# Patient Record
Sex: Female | Born: 1995 | Race: White | Hispanic: No | Marital: Single | Smoking: Former smoker
Health system: Southern US, Community
[De-identification: ages and names within clinical notes are randomized; demographics above are authoritative.]

## PROBLEM LIST (undated history)

## (undated) DIAGNOSIS — Z789 Other specified health status: Secondary | ICD-10-CM

## (undated) DIAGNOSIS — L219 Seborrheic dermatitis, unspecified: Secondary | ICD-10-CM

## (undated) DIAGNOSIS — J310 Chronic rhinitis: Secondary | ICD-10-CM

## (undated) DIAGNOSIS — F329 Major depressive disorder, single episode, unspecified: Secondary | ICD-10-CM

## (undated) DIAGNOSIS — F419 Anxiety disorder, unspecified: Secondary | ICD-10-CM

## (undated) DIAGNOSIS — K219 Gastro-esophageal reflux disease without esophagitis: Secondary | ICD-10-CM

## (undated) DIAGNOSIS — F32A Depression, unspecified: Secondary | ICD-10-CM

## (undated) DIAGNOSIS — O24419 Gestational diabetes mellitus in pregnancy, unspecified control: Secondary | ICD-10-CM

## (undated) DIAGNOSIS — Z91018 Allergy to other foods: Secondary | ICD-10-CM

## (undated) DIAGNOSIS — L309 Dermatitis, unspecified: Secondary | ICD-10-CM

## (undated) HISTORY — DX: Depression, unspecified: F32.A

## (undated) HISTORY — PX: CHOLESTEATOMA EXCISION: SHX1345

## (undated) HISTORY — DX: Dermatitis, unspecified: L30.9

## (undated) HISTORY — DX: Gastro-esophageal reflux disease without esophagitis: K21.9

## (undated) HISTORY — DX: Anxiety disorder, unspecified: F41.9

## (undated) HISTORY — PX: OTHER SURGICAL HISTORY: SHX169

---
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
## 2015-08-05 HISTORY — PX: CHOLECYSTECTOMY: SHX55

## 2016-10-16 ENCOUNTER — Ambulatory Visit (INDEPENDENT_AMBULATORY_CARE_PROVIDER_SITE_OTHER): Payer: BLUE CROSS/BLUE SHIELD | Admitting: Allergy and Immunology

## 2016-10-16 ENCOUNTER — Encounter: Payer: Self-pay | Admitting: Allergy and Immunology

## 2016-10-16 VITALS — BP 112/70 | HR 98 | Temp 98.3°F | Resp 16 | Ht 64.76 in | Wt 133.8 lb

## 2016-10-16 DIAGNOSIS — R198 Other specified symptoms and signs involving the digestive system and abdomen: Secondary | ICD-10-CM

## 2016-10-16 DIAGNOSIS — L219 Seborrheic dermatitis, unspecified: Secondary | ICD-10-CM

## 2016-10-16 DIAGNOSIS — Z91018 Allergy to other foods: Secondary | ICD-10-CM

## 2016-10-16 DIAGNOSIS — J300 Vasomotor rhinitis: Secondary | ICD-10-CM

## 2016-10-16 DIAGNOSIS — J310 Chronic rhinitis: Secondary | ICD-10-CM

## 2016-10-16 DIAGNOSIS — R519 Headache, unspecified: Secondary | ICD-10-CM

## 2016-10-16 DIAGNOSIS — R51 Headache: Secondary | ICD-10-CM

## 2016-10-16 HISTORY — DX: Headache, unspecified: R51.9

## 2016-10-16 HISTORY — DX: Chronic rhinitis: J31.0

## 2016-10-16 HISTORY — DX: Allergy to other foods: Z91.018

## 2016-10-16 HISTORY — DX: Seborrheic dermatitis, unspecified: L21.9

## 2016-10-16 MED ORDER — KETOCONAZOLE 2 % EX SHAM: 1.0000 "application " | MEDICATED_SHAMPOO | CUTANEOUS | 5 refills | Status: DC

## 2016-10-16 NOTE — Assessment & Plan Note (Signed)
Non-allergic rhinitis.  All seasonal and perennial aeroallergen skin tests are negative despite a positive histamine control.  Intranasal steroids and intranasal antihistamines are effective for symptoms associated with non-allergic rhinitis, whereas second generation antihistamines such as cetirizine, loratadine and fexofenadine have been found to be ineffective for this condition.  Fluticasone (Flonase) nasal spray, one spray per nostril 1-2 times daily as needed.  Nasal saline spray (i.e. Simply Saline) as needed prior to medicated nasal sprays.

## 2016-10-16 NOTE — Progress Notes (Signed)
New Patient Note  RE: Shelby Hayden MRN: [id number] DOB: 1995/10/28 Date of Office Visit: 10/16/2016  Referring provider: No ref. provider found Primary care provider: Marthenia Rolling, MD  Chief Complaint: Nausea; Vomiting; and Rash   History of present illness: Shelby Hayden is a 21 y.o. female presenting today for evaluation of possible food allergy/intolerance.  She reports that since January 2017 she has experienced 3 prolonged episodes of nausea, vomiting, and abdominal cramping.  She is interested in allergy evaluation in her search for an etiology though no specific food or environmental triggers have been identified which seemed to correlate with these symptoms. The episodes last from a few weeks to a couple months.  Typically, the episodes start out with such severity that she vomits bile and is unable to consume food or beverages, therefore requiring Phenergan suppositories and IV hydration.  Over time, the symptoms come less severe and she is able to control the nausea and vomiting with Zofran.  She does have associated right-sided headaches as well as photophobia and audiophobia.  She prefers to lie down in a dark room without any noise.  She denies scotoma.  She states that on a few occasions she has become lightheaded and almost passed out when standing up in on one occasion states that she did pass out.  She has had extensive gastroenterology evaluation.  In early 2017 she underwent cholecystectomy, however her symptoms had resolved prior to the surgery and returned in June 2017 after the surgery.  Gelene experiences rhinorrhea and occasional postnasal drainage. No significant seasonal symptom variation has been noted nor have specific environmental triggers been identified.  She develops oily, flaky, mildly pruritic, erythematous areas around the creases of her nose, ears, as well as on the medial aspects of her eyelids as well as her scalp.  She has tried topical steroids without  benefit.   Assessment and plan: GI symptoms The patient's history suggests the possibility of atypical migraines. Skin tests to select food allergens were negative today. The negative predictive value of food allergen skin testing is excellent (approximately 95%).   If GI symptoms recur, persist or progress, neurologist evaluation may be warranted.  Recurrent headaches  I have recommended neurologist evaluation/treatment.  Chronic rhinitis Non-allergic rhinitis.  All seasonal and perennial aeroallergen skin tests are negative despite a positive histamine control.  Intranasal steroids and intranasal antihistamines are effective for symptoms associated with non-allergic rhinitis, whereas second generation antihistamines such as cetirizine, loratadine and fexofenadine have been found to be ineffective for this condition.  Fluticasone (Flonase) nasal spray, one spray per nostril 1-2 times daily as needed.  Nasal saline spray (i.e. Simply Saline) as needed prior to medicated nasal sprays.  Seborrheic dermatitis  A prescription has been provided for Nizoral 2% shampoo.  Lather on the scalp and face, and ears daily as needed.   Meds ordered this encounter  Medications  . ketoconazole (NIZORAL) 2 % shampoo    Sig: Apply 1 application topically 2 (two) times a week.    Dispense:  120 mL    Refill:  5    Diagnostics: Environmental skin testing:  Negative despite a positive histamine control. Food allergen skin testing:  Negative despite a positive histamine control.    Physical examination: Blood pressure 112/70, pulse 98, temperature 98.3 F (36.8 C), temperature source Oral, resp. rate 16, height 5' 4.76" (1.645 m), weight 133 lb 13.1 oz (60.7 kg), SpO2 98 %.  General: Alert, interactive, in no acute distress. HEENT: TMs pearly gray,  turbinates mildly edematous without discharge, post-pharynx mildly erythematous. Neck: Supple without lymphadenopathy. Lungs: Clear to auscultation  without wheezing, rhonchi or rales. CV: Normal S1, S2 without murmurs. Abdomen: Nondistended, nontender. Skin: flaky, erythematous areas around the ear and eyelids. Extremities:  No clubbing, cyanosis or edema. Neuro:   Grossly intact.  Review of systems:  Review of systems negative except as noted in HPI / PMHx or noted below: Review of Systems  Constitutional: Negative.   HENT: Negative.   Eyes: Negative.   Respiratory: Negative.   Cardiovascular: Negative.   Gastrointestinal: Negative.   Genitourinary: Negative.   Musculoskeletal: Negative.   Skin: Negative.   Neurological: Negative.   Endo/Heme/Allergies: Negative.   Psychiatric/Behavioral: Negative.     Past medical history:  Past Medical History:  Diagnosis Date  . Eczema     Past surgical history:  Past Surgical History:  Procedure Laterality Date  . CHOLECYSTECTOMY  08/2015    Family history: Family History  Problem Relation Age of Onset  . Asthma Sister   . Allergic rhinitis Sister   . Asthma Maternal Grandmother   . Asthma Paternal Grandmother     Social history: Social History   Social History  . Marital status: Single    Spouse name: N/A  . Number of children: N/A  . Years of education: N/A   Occupational History  . Not on file.   Social History Main Topics  . Smoking status: Former Smoker    Packs/day: 1.00    Types: Cigarettes  . Smokeless tobacco: Never Used     Comment: one  pack Q 2-3 weeks  . Alcohol use Not on file  . Drug use: Unknown  . Sexual activity: Not on file   Other Topics Concern  . Not on file   Social History Narrative  . No narrative on file   Environmental History: The patient lives in a 21 year old house with laminate floors throughout, electric heat, and window air conditioning units.  There is no known mold or water damage in the home.  There is a dog and a cat in the house which have access to her bedroom.    Allergies as of 10/16/2016      Reactions    Latex Rash      Medication List       Accurate as of 10/16/16  1:23 PM. Always use your most recent med list.          famotidine 20 MG tablet Commonly known as:  PEPCID Take 20 mg by mouth daily.   Ibuprofen 200 MG Caps Take by mouth as needed.   ketoconazole 2 % shampoo Commonly known as:  NIZORAL Apply 1 application topically 2 (two) times a week.   ondansetron 4 MG disintegrating tablet Commonly known as:  ZOFRAN-ODT Take 4 mg by mouth.       Known medication allergies: Allergies  Allergen Reactions  . Latex Rash    I appreciate the opportunity to take part in Dyonna's care. Please do not hesitate to contact me with questions.  Sincerely,   R. Jorene Guestarter Noralee Dutko, MD

## 2016-10-16 NOTE — Assessment & Plan Note (Signed)
   I have recommended neurologist evaluation/treatment.

## 2016-10-16 NOTE — Assessment & Plan Note (Signed)
The patient's history suggests the possibility of atypical migraines. Skin tests to select food allergens were negative today. The negative predictive value of food allergen skin testing is excellent (approximately 95%).   If GI symptoms recur, persist or progress, neurologist evaluation may be warranted.

## 2016-10-16 NOTE — Patient Instructions (Signed)
GI symptoms The patient's history suggests the possibility of atypical migraines. Skin tests to select food allergens were negative today. The negative predictive value of food allergen skin testing is excellent (approximately 95%).   If GI symptoms recur, persist or progress, neurologist evaluation may be warranted.  Recurrent headaches  I have recommended neurologist evaluation/treatment.  Chronic rhinitis Non-allergic rhinitis.  All seasonal and perennial aeroallergen skin tests are negative despite a positive histamine control.  Intranasal steroids and intranasal antihistamines are effective for symptoms associated with non-allergic rhinitis, whereas second generation antihistamines such as cetirizine, loratadine and fexofenadine have been found to be ineffective for this condition.  Fluticasone (Flonase) nasal spray, one spray per nostril 1-2 times daily as needed.  Nasal saline spray (i.e. Simply Saline) as needed prior to medicated nasal sprays.  Seborrheic dermatitis  A prescription has been provided for Nizoral 2% shampoo.  Lather on the scalp and face, and ears daily as needed.   Return if symptoms worsen or fail to improve.

## 2016-10-16 NOTE — Assessment & Plan Note (Signed)
   A prescription has been provided for Nizoral 2% shampoo.  Lather on the scalp and face, and ears daily as needed.

## 2017-12-23 DIAGNOSIS — F17200 Nicotine dependence, unspecified, uncomplicated: Secondary | ICD-10-CM | POA: Insufficient documentation

## 2018-07-23 LAB — TSH: TSH: 0.843

## 2018-07-26 LAB — OB RESULTS CONSOLE HIV ANTIBODY (ROUTINE TESTING): HIV: NONREACTIVE

## 2018-07-26 LAB — OB RESULTS CONSOLE ABO/RH: RH Type: POSITIVE

## 2018-07-26 LAB — CHG URINE PREGNANCY TEST VISUAL COLOR CMPRSN METHS: Preg Test, Ur: POSITIVE

## 2018-07-26 LAB — OB RESULTS CONSOLE HGB/HCT, BLOOD
HCT: 46 — AB (ref 29–41)
Hemoglobin: 16.1

## 2018-07-26 LAB — OB RESULTS CONSOLE ANTIBODY SCREEN: Antibody Screen: NEGATIVE

## 2018-07-26 LAB — OB RESULTS CONSOLE PLATELET COUNT: Platelets: 206

## 2018-07-26 LAB — OB RESULTS CONSOLE GC/CHLAMYDIA
Chlamydia: NEGATIVE
Gonorrhea: NEGATIVE

## 2018-07-26 LAB — CULTURE, OB URINE: Urine Culture, OB: <10000

## 2018-07-26 LAB — OB RESULTS CONSOLE VARICELLA ZOSTER ANTIBODY, IGG: Varicella: IMMUNE

## 2018-07-26 LAB — OB RESULTS CONSOLE RUBELLA ANTIBODY, IGM: Rubella: IMMUNE

## 2018-07-26 LAB — OB RESULTS CONSOLE HEPATITIS B SURFACE ANTIGEN: Hepatitis B Surface Ag: NEGATIVE

## 2018-08-04 NOTE — L&D Delivery Note (Signed)
LABOR COURSE Patient was admitted for IOL for A1GDM. She received two doses of Cytotec and had a foley bulb placed as part of her induction. She was also augmented with Pitocin. She SROM'd today at 0730.   Delivery Note Called to room and patient was complete and pushing. Head delivered ROT. LKoose nuchal cord x 1 loop present. Delivered through. Shoulder and body delivered in usual fashion. At  1622 a viable female was delivered via Vaginal, Spontaneous (Presentation:ROT ; ROA).  Infant with poor color, tone and without spontaneous cry, placed on mother's abdomen, dried and stimulated. Cord clamped x 2 after 1-minute delay, and cut by FOB. Taken to warmer for further assessment. Cord blood drawn. Placenta delivered spontaneously with gentle cord traction. Appears intact. Fundus firm with massage and Pitocin. Labia, perineum, vagina, and cervix inspected with no lacerations noted.    APGAR:7,9; weight: 3405g  .   Cord: 3VC with the following complications:N/A.   Cord pH: Not collected  Anesthesia: Epidural Episiotomy: None Lacerations: None Q Blood Loss (mL): 200  Mom to postpartum.  Baby to Couplet care / Skin to Skin.  Mallie Snooks, CNM 03/12/19 7:45 PM

## 2018-08-24 LAB — FIRST TRIMESTER SCREEN W/NT: FIRST TRIMESTER SAMPLE: NEGATIVE

## 2018-10-12 LAB — AFP, SERUM, OPEN SPINA BIFIDA: AFP: NEGATIVE

## 2018-12-06 LAB — OB RESULTS CONSOLE RPR
RPR: NONREACTIVE
RPR: NONREACTIVE

## 2018-12-06 LAB — OB RESULTS CONSOLE HGB/HCT, BLOOD
HCT: 37 (ref 29–41)
Hemoglobin: 12.7

## 2018-12-06 LAB — OB RESULTS CONSOLE HIV ANTIBODY (ROUTINE TESTING): HIV: NONREACTIVE

## 2018-12-06 LAB — GLUCOSE, 1 HOUR: Glucose 1 Hour: 168

## 2018-12-06 LAB — OB RESULTS CONSOLE PLATELET COUNT: Platelets: 200

## 2018-12-09 DIAGNOSIS — O24410 Gestational diabetes mellitus in pregnancy, diet controlled: Secondary | ICD-10-CM | POA: Insufficient documentation

## 2018-12-10 LAB — GLUCOSE, 3 HOUR GESTATIONAL

## 2018-12-22 ENCOUNTER — Other Ambulatory Visit: Payer: Self-pay

## 2018-12-22 ENCOUNTER — Inpatient Hospital Stay (HOSPITAL_COMMUNITY)
Admission: EM | Admit: 2018-12-22 | Discharge: 2018-12-22 | Disposition: A | Discharge status: Home / Self Care | Payer: BLUE CROSS/BLUE SHIELD | Attending: Family Medicine | Admitting: Family Medicine

## 2018-12-22 ENCOUNTER — Encounter (HOSPITAL_COMMUNITY): Payer: Self-pay | Admitting: *Deleted

## 2018-12-22 DIAGNOSIS — Z91040 Latex allergy status: Secondary | ICD-10-CM | POA: Diagnosis not present

## 2018-12-22 DIAGNOSIS — R109 Unspecified abdominal pain: Secondary | ICD-10-CM | POA: Insufficient documentation

## 2018-12-22 DIAGNOSIS — O9989 Other specified diseases and conditions complicating pregnancy, childbirth and the puerperium: Secondary | ICD-10-CM | POA: Diagnosis not present

## 2018-12-22 DIAGNOSIS — Z87891 Personal history of nicotine dependence: Secondary | ICD-10-CM | POA: Insufficient documentation

## 2018-12-22 DIAGNOSIS — Z833 Family history of diabetes mellitus: Secondary | ICD-10-CM | POA: Diagnosis not present

## 2018-12-22 DIAGNOSIS — Z8042 Family history of malignant neoplasm of prostate: Secondary | ICD-10-CM | POA: Insufficient documentation

## 2018-12-22 DIAGNOSIS — Z3493 Encounter for supervision of normal pregnancy, unspecified, third trimester: Secondary | ICD-10-CM

## 2018-12-22 DIAGNOSIS — Z3A29 29 weeks gestation of pregnancy: Secondary | ICD-10-CM | POA: Diagnosis not present

## 2018-12-22 HISTORY — DX: Other specified health status: Z78.9

## 2018-12-22 LAB — CBC WITH DIFFERENTIAL/PLATELET
Abs Immature Granulocytes: 0.03 10*3/uL (ref 0.00–0.07)
Basophils Absolute: 0 10*3/uL (ref 0.0–0.1)
Basophils Relative: 1 %
Eosinophils Absolute: 0 10*3/uL (ref 0.0–0.5)
Eosinophils Relative: 0 %
HCT: 35.3 % — ABNORMAL LOW (ref 36.0–46.0)
Hemoglobin: 12 g/dL (ref 12.0–15.0)
Immature Granulocytes: 0 %
Lymphocytes Relative: 18 %
Lymphs Abs: 1.5 10*3/uL (ref 0.7–4.0)
MCH: 29.4 pg (ref 26.0–34.0)
MCHC: 34 g/dL (ref 30.0–36.0)
MCV: 86.5 fL (ref 80.0–100.0)
Monocytes Absolute: 0.7 10*3/uL (ref 0.1–1.0)
Monocytes Relative: 8 %
Neutro Abs: 6.1 10*3/uL (ref 1.7–7.7)
Neutrophils Relative %: 73 %
Platelets: 190 10*3/uL (ref 150–400)
RBC: 4.08 MIL/uL (ref 3.87–5.11)
RDW: 12.1 % (ref 11.5–15.5)
WBC: 8.4 10*3/uL (ref 4.0–10.5)
nRBC: 0 % (ref 0.0–0.2)

## 2018-12-22 LAB — BASIC METABOLIC PANEL
Anion gap: 8 (ref 5–15)
BUN: <5 mg/dL — ABNORMAL LOW (ref 6–20)
CO2: 21 mmol/L — ABNORMAL LOW (ref 22–32)
Calcium: 8.8 mg/dL — ABNORMAL LOW (ref 8.9–10.3)
Chloride: 108 mmol/L (ref 98–111)
Creatinine, Ser: 0.41 mg/dL — ABNORMAL LOW (ref 0.44–1.00)
GFR calc Af Amer: >60 mL/min (ref >60)
GFR calc non Af Amer: >60 mL/min (ref >60)
Glucose, Bld: 141 mg/dL — ABNORMAL HIGH (ref 70–99)
Potassium: 3.4 mmol/L — ABNORMAL LOW (ref 3.5–5.1)
Sodium: 137 mmol/L (ref 135–145)

## 2018-12-22 LAB — URINALYSIS, ROUTINE W REFLEX MICROSCOPIC
Bilirubin Urine: NEGATIVE
Glucose, UA: NEGATIVE mg/dL
Hgb urine dipstick: NEGATIVE
Ketones, ur: 20 mg/dL — AB
Nitrite: NEGATIVE
Protein, ur: NEGATIVE mg/dL
Specific Gravity, Urine: 1.013 (ref 1.005–1.030)
pH: 7 (ref 5.0–8.0)

## 2018-12-22 LAB — ABO/RH: ABO/RH(D): O POS

## 2018-12-22 LAB — GLUCOSE, CAPILLARY: Glucose-Capillary: 153 mg/dL — ABNORMAL HIGH (ref 70–99)

## 2018-12-22 MED ORDER — ACETAMINOPHEN 500 MG PO TABS
1000.0000 mg | ORAL_TABLET | Freq: Once | ORAL | Status: AC
  Administered 2018-12-22: 1000 mg via ORAL
  Filled 2018-12-22: qty 2
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
## 2018-12-23 ENCOUNTER — Encounter: Payer: Self-pay | Admitting: Allergy and Immunology

## 2018-12-23 ENCOUNTER — Encounter (HOSPITAL_COMMUNITY): Payer: Self-pay

## 2019-01-26 ENCOUNTER — Encounter: Payer: Self-pay | Admitting: *Deleted

## 2019-01-28 ENCOUNTER — Other Ambulatory Visit: Payer: Self-pay

## 2019-01-28 ENCOUNTER — Ambulatory Visit (INDEPENDENT_AMBULATORY_CARE_PROVIDER_SITE_OTHER): Payer: BC Managed Care – PPO | Admitting: Obstetrics & Gynecology

## 2019-01-28 ENCOUNTER — Encounter: Payer: Self-pay | Admitting: Obstetrics & Gynecology

## 2019-01-28 VITALS — BP 121/74 | HR 90 | Temp 98.0°F | Wt 194.9 lb

## 2019-01-28 DIAGNOSIS — O0993 Supervision of high risk pregnancy, unspecified, third trimester: Secondary | ICD-10-CM

## 2019-01-28 DIAGNOSIS — O24410 Gestational diabetes mellitus in pregnancy, diet controlled: Secondary | ICD-10-CM

## 2019-01-28 DIAGNOSIS — Z3A34 34 weeks gestation of pregnancy: Secondary | ICD-10-CM

## 2019-01-28 NOTE — Progress Notes (Addendum)
   PRENATAL VISIT NOTE  Subjective:  Shelby Hayden is a 23 y.o. G1P0000 at [redacted]w[redacted]d being seen today for transfer of prenatal care from [facility].  Previous prenatal notes are seen in [place].   She is currently monitored for the following issues for this high-risk pregnancy and has Diet controlled gestational diabetes mellitus in third trimester; Depression; Smoker; and Supervision of high-risk pregnancy, third trimester on their problem list.  Patient reports no complaints.  Contractions: Not present. Vag. Bleeding: None.  Movement: Present. Denies leaking of fluid.   The following portions of the patient's history were reviewed and updated as appropriate: allergies, current medications, past family history, past medical history, past social history, past surgical history and problem list.   Objective:   Vitals:   01/28/19 1010  BP: 121/74  Pulse: 90  Temp: 98 F (36.7 C)  Weight: 194 lb 14.4 oz (88.4 kg)    Fetal Status: Fetal Heart Rate (bpm): 145 Fundal Height: 34 cm Movement: Present     General:  Alert, oriented and cooperative. Patient is in no acute distress.  Skin: Skin is warm and dry. No rash noted.   Cardiovascular: Normal heart rate noted  Respiratory: Normal respiratory effort, no problems with respiration noted  Abdomen: Soft, gravid, appropriate for gestational age.  Pain/Pressure: Absent     Pelvic: Cervical exam deferred        Extremities: Normal range of motion.  Edema: None  Mental Status: Normal mood and affect. Normal behavior. Normal judgment and thought content.   Assessment and Plan:  Pregnancy: G1P0000 at [redacted]w[redacted]d 1. Diet controlled gestational diabetes mellitus in third trimester Reports normal BS, did not bring log. Continue diet control. Growth scan ordered at 37 weeks.  - [place] MFM OB DETAIL +14 WK; Future  2. Supervision of high-risk pregnancy, third trimester Pelvic cultures next visit. Enrolled in [employer], will get BP cuff soon.  - [facility] AMB  BABYSCRIPTS SCHEDULE OPTIMIZATION - Enroll Patient in [employer]  Preterm labor symptoms and general obstetric precautions including but not limited to vaginal bleeding, contractions, leaking of fluid and fetal movement were reviewed in detail with the patient. Please refer to After Visit Summary for other counseling recommendations.   The nature of [place] with multiple MDs and other Advanced Practice Providers was explained to patient; also emphasized that residents, students are part of our team.  Return in about 17 days (around 02/14/2019) for Office [facility] Visit, Pelvic cultures (schedule on same day as ultrasound).  Future Appointments  Date Time Provider [place]  02/15/2019  2:15 PM [place] 4 WH-MFCUS MFC-US  02/15/2019  2:20 PM [place] NURSE [place] MFC-US  02/17/2019  3:55 PM [place] Mode, MD [place] Janiene Aarons, MD

## 2019-01-28 NOTE — Patient Instructions (Addendum)
Return to office for any scheduled appointments. Call the office or go to the MAU at [place] at [facility] if:  You begin to have strong, frequent contractions  Your water breaks.  Sometimes it is a big gush of fluid, sometimes it is just a trickle that keeps getting your panties wet or running down your legs  You have vaginal bleeding.  It is normal to have a small amount of spotting if your cervix was checked.   You do not feel your baby moving like normal.  If you do not, get something to eat and drink and lay down and focus on feeling your baby move.   If your baby is still not moving like normal, you should call the office or go to MAU.  Any other obstetric concerns.    Third Trimester of Pregnancy The third trimester is from week 28 through week 40 (months 7 through 9). The third trimester is a time when the unborn baby (fetus) is growing rapidly. At the end of the ninth month, the fetus is about 20 inches in length and weighs 6-10 pounds. Body changes during your third trimester Your body will continue to go through many changes during pregnancy. The changes vary from woman to woman. During the third trimester:  Your weight will continue to increase. You can expect to gain 25-35 pounds (11-16 kg) by the end of the pregnancy.  You may begin to get stretch marks on your hips, abdomen, and breasts.  You may urinate more often because the fetus is moving lower into your pelvis and pressing on your bladder.  You may develop or continue to have heartburn. This is caused by increased hormones that slow down muscles in the digestive tract.  You may develop or continue to have constipation because increased hormones slow digestion and cause the muscles that push waste through your intestines to relax.  You may develop hemorrhoids. These are swollen veins (varicose veins) in the rectum that can itch or be painful.  You may develop swollen, bulging veins (varicose  veins) in your legs.  You may have increased body aches in the pelvis, back, or thighs. This is due to weight gain and increased hormones that are relaxing your joints.  You may have changes in your hair. These can include thickening of your hair, rapid growth, and changes in texture. Some women also have hair loss during or after pregnancy, or hair that feels dry or thin. Your hair will most likely return to normal after your baby is born.  Your breasts will continue to grow and they will continue to become tender. A yellow fluid (colostrum) may leak from your breasts. This is the first milk you are producing for your baby.  Your belly button may stick out.  You may notice more swelling in your hands, face, or ankles.  You may have increased tingling or numbness in your hands, arms, and legs. The skin on your belly may also feel numb.  You may feel short of breath because of your expanding uterus.  You may have more problems sleeping. This can be caused by the size of your belly, increased need to urinate, and an increase in your body's metabolism.  You may notice the fetus "dropping," or moving lower in your abdomen (lightening).  You may have increased vaginal discharge.  You may notice your joints feel loose and you may have pain around your pelvic bone. What to expect at prenatal visits You will  have prenatal exams every 2 weeks until week 36. Then you will have weekly prenatal exams. During a routine prenatal visit:  You will be weighed to make sure you and the baby are growing normally.  Your blood pressure will be taken.  Your abdomen will be measured to track your baby's growth.  The fetal heartbeat will be listened to.  Any test results from the previous visit will be discussed.  You may have a cervical check near your due date to see if your cervix has softened or thinned (effaced).  You will be tested for Group B streptococcus. This happens between 35 and 37 weeks.  Your health care provider may ask you:  What your birth plan is.  How you are feeling.  If you are feeling the baby move.  If you have had any abnormal symptoms, such as leaking fluid, bleeding, severe headaches, or abdominal cramping.  If you are using any tobacco products, including cigarettes, chewing tobacco, and electronic cigarettes.  If you have any questions. Other tests or screenings that may be performed during your third trimester include:  Blood tests that check for low iron levels (anemia).  Fetal testing to check the health, activity level, and growth of the fetus. Testing is done if you have certain medical conditions or if there are problems during the pregnancy.  Nonstress test (NST). This test checks the health of your baby to make sure there are no signs of problems, such as the baby not getting enough oxygen. During this test, a belt is placed around your belly. The baby is made to move, and its heart rate is monitored during movement. What is false labor? False labor is a condition in which you feel small, irregular tightenings of the muscles in the womb (contractions) that usually go away with rest, changing position, or drinking water. These are called Braxton Hicks contractions. Contractions may last for hours, days, or even weeks before true labor sets in. If contractions come at regular intervals, become more frequent, increase in intensity, or become painful, you should see your health care provider. What are the signs of labor?  Abdominal cramps.  Regular contractions that start at 10 minutes apart and become stronger and more frequent with time.  Contractions that start on the top of the uterus and spread down to the lower abdomen and back.  Increased pelvic pressure and dull back pain.  A watery or bloody mucus discharge that comes from the vagina.  Leaking of amniotic fluid. This is also known as your "water breaking." It could be a slow trickle or a  gush. Let your health care provider know if it has a color or strange odor. If you have any of these signs, call your health care provider right away, even if it is before your due date. Follow these instructions at home: Medicines  Follow your health care provider's instructions regarding medicine use. Specific medicines may be either safe or unsafe to take during pregnancy.  Take a prenatal vitamin that contains at least 600 micrograms (mcg) of folic acid.  If you develop constipation, try taking a stool softener if your health care provider approves. Eating and drinking   Eat a balanced diet that includes fresh fruits and vegetables, whole grains, good sources of protein such as meat, eggs, or tofu, and low-fat dairy. Your health care provider will help you determine the amount of weight gain that is right for you.  Avoid raw meat and uncooked cheese. These carry germs  that can cause birth defects in the baby.  If you have low calcium intake from food, talk to your health care provider about whether you should take a daily calcium supplement.  Eat four or five small meals rather than three large meals a day.  Limit foods that are high in fat and processed sugars, such as fried and sweet foods.  To prevent constipation: ? Drink enough fluid to keep your urine clear or pale yellow. ? Eat foods that are high in fiber, such as fresh fruits and vegetables, whole grains, and beans. Activity  Exercise only as directed by your health care provider. Most women can continue their usual exercise routine during pregnancy. Try to exercise for 30 minutes at least 5 days a week. Stop exercising if you experience uterine contractions.  Avoid heavy lifting.  Do not exercise in extreme heat or humidity, or at high altitudes.  Wear low-heel, comfortable shoes.  Practice good posture.  You may continue to have sex unless your health care provider tells you otherwise. Relieving pain and  discomfort  Take frequent breaks and rest with your legs elevated if you have leg cramps or low back pain.  Take warm sitz baths to soothe any pain or discomfort caused by hemorrhoids. Use hemorrhoid cream if your health care provider approves.  Wear a good support bra to prevent discomfort from breast tenderness.  If you develop varicose veins: ? Wear support pantyhose or compression stockings as told by your healthcare provider. ? Elevate your feet for 15 minutes, 3-4 times a day. Prenatal care  Write down your questions. Take them to your prenatal visits.  Keep all your prenatal visits as told by your health care provider. This is important. Safety  Wear your seat belt at all times when driving.  Make a list of emergency phone numbers, including numbers for family, friends, the hospital, and police and fire departments. General instructions  Avoid cat litter boxes and soil used by cats. These carry germs that can cause birth defects in the baby. If you have a cat, ask someone to clean the litter box for you.  Do not travel far distances unless it is absolutely necessary and only with the approval of your health care provider.  Do not use hot tubs, steam rooms, or saunas.  Do not drink alcohol.  Do not use any products that contain nicotine or tobacco, such as cigarettes and e-cigarettes. If you need help quitting, ask your health care provider.  Do not use any medicinal herbs or unprescribed drugs. These chemicals affect the formation and growth of the baby.  Do not douche or use tampons or scented sanitary pads.  Do not cross your legs for long periods of time.  To prepare for the arrival of your baby: ? Take prenatal classes to understand, practice, and ask questions about labor and delivery. ? Make a trial run to the hospital. ? Visit the hospital and tour the maternity area. ? Arrange for maternity or paternity leave through employers. ? Arrange for family and  friends to take care of pets while you are in the hospital. ? Purchase a rear-facing car seat and make sure you know how to install it in your car. ? Pack your hospital bag. ? Prepare the baby's nursery. Make sure to remove all pillows and stuffed animals from the baby's crib to prevent suffocation.  Visit your dentist if you have not gone during your pregnancy. Use a soft toothbrush to brush your teeth and  be gentle when you floss. Contact a health care provider if:  You are unsure if you are in labor or if your water has broken.  You become dizzy.  You have mild pelvic cramps, pelvic pressure, or nagging pain in your abdominal area.  You have lower back pain.  You have persistent nausea, vomiting, or diarrhea.  You have an unusual or bad smelling vaginal discharge.  You have pain when you urinate. Get help right away if:  Your water breaks before 37 weeks.  You have regular contractions less than 5 minutes apart before 37 weeks.  You have a fever.  You are leaking fluid from your vagina.  You have spotting or bleeding from your vagina.  You have severe abdominal pain or cramping.  You have rapid weight loss or weight gain.  You have shortness of breath with chest pain.  You notice sudden or extreme swelling of your face, hands, ankles, feet, or legs.  Your baby makes fewer than 10 movements in 2 hours.  You have severe headaches that do not go away when you take medicine.  You have vision changes. Summary  The third trimester is from week 28 through week 40, months 7 through 9. The third trimester is a time when the unborn baby (fetus) is growing rapidly.  During the third trimester, your discomfort may increase as you and your baby continue to gain weight. You may have abdominal, leg, and back pain, sleeping problems, and an increased need to urinate.  During the third trimester your breasts will keep growing and they will continue to become tender. A yellow  fluid (colostrum) may leak from your breasts. This is the first milk you are producing for your baby.  False labor is a condition in which you feel small, irregular tightenings of the muscles in the womb (contractions) that eventually go away. These are called Braxton Hicks contractions. Contractions may last for hours, days, or even weeks before true labor sets in.  Signs of labor can include: abdominal cramps; regular contractions that start at 10 minutes apart and become stronger and more frequent with time; watery or bloody mucus discharge that comes from the vagina; increased pelvic pressure and dull back pain; and leaking of amniotic fluid. This information is not intended to replace advice given to you by your health care provider. Make sure you discuss any questions you have with your health care provider. Document Released: 07/15/2001 Document Revised: 11/11/2018 Document Reviewed: 08/26/2016 Elsevier Patient Education  2020 [employer].    Gestational Diabetes Mellitus, Self Care Caring for yourself after you have been diagnosed with gestational diabetes (gestational diabetes mellitus) means keeping your blood sugar (glucose) under control. You can do that with a balance of:  Nutrition.  Exercise.  Lifestyle changes.  Medicines or insulin, if necessary.  Support from your team of health care providers and others. The following information explains what you need to know to manage your gestational diabetes at home. What are the risks? If gestational diabetes is treated, it is unlikely to cause problems. If it is not controlled with treatment, it may cause problems during labor and delivery, and some of those problems can be harmful to the unborn baby (fetus) and the mother. Uncontrolled gestational diabetes may also cause the newborn baby to have breathing problems and low blood glucose. Women who get gestational diabetes are more likely to develop it if they get pregnant again,  and they are more likely to develop type 2 diabetes in  the future. How to monitor blood glucose   Check your blood glucose every day during your pregnancy. Do this as often as told by your health care provider.  Contact your health care provider if your blood glucose is above your target for two tests in a row. Your health care provider will set individualized treatment goals for you. Generally, the goal of treatment is to maintain the following blood glucose levels during pregnancy:  Before meals (preprandial): at or below 95 mg/dL (5.3 mmol/L).  After meals (postprandial): ? One hour after a meal: at or below 140 mg/dL (7.8 mmol/L). ? Two hours after a meal: at or below 120 mg/dL (6.7 mmol/L).  A1c (hemoglobin A1c) level: 6-6.5%. How to manage hyperglycemia and hypoglycemia Hyperglycemia symptoms Hyperglycemia, also called high blood glucose, occurs when blood glucose is too high. Make sure you know the early signs of hyperglycemia, such as:  Increased thirst.  Hunger.  Feeling very tired.  Needing to urinate more often than usual.  Blurry vision. Hypoglycemia symptoms Hypoglycemia, also called low blood glucose, occurs with a blood glucose level at or below 70 mg/dL (3.9 mmol/L). The risk for hypoglycemia increases during or after exercise, during sleep, during illness, and when skipping meals or not eating for a long time (fasting). Symptoms may include:  Hunger.  Anxiety.  Sweating and feeling clammy.  Confusion.  Dizziness or feeling light-headed.  Sleepiness.  Nausea.  Increased heart rate.  Headache.  Blurry vision.  Irritability.  Tingling or numbness around the mouth, lips, or tongue.  A change in coordination.  Restless sleep.  Fainting.  Seizure. It is important to know the symptoms of hypoglycemia and treat it right away. Always have a 15-gram rapid-acting carbohydrate snack with you to treat low blood glucose. Family members and close  friends should also know the symptoms and should understand how to treat hypoglycemia, in case you are not able to treat yourself. Treating hypoglycemia If you are alert and able to swallow safely, follow the 15:15 rule:  Take 15 grams of a rapid-acting carbohydrate. Talk with your health care provider about how much you should take.  Rapid-acting options include: ? Glucose pills (take 15 grams). ? 6-8 pieces of hard candy. ? 4-6 oz (120-150 mL) of fruit juice. ? 4-6 oz (120-150 mL) of regular (not diet) soda. ? 1 Tbsp (15 mL) honey or sugar.  Check your blood glucose 15 minutes after you take the carbohydrate.  If the repeat blood glucose level is still at or below 70 mg/dL (3.9 mmol/L), take 15 grams of a carbohydrate again.  If your blood glucose level does not increase above 70 mg/dL (3.9 mmol/L) after 3 tries, seek emergency medical care.  After your blood glucose level returns to normal, eat a meal or a snack within 1 hour. Treating severe hypoglycemia Severe hypoglycemia is when your blood glucose level is at or below 54 mg/dL (3 mmol/L). Severe hypoglycemia is an emergency. Do not wait to see if the symptoms will go away. Get medical help right away. Call your local emergency services (911 in the [place] If you have severe hypoglycemia and you cannot eat or drink, you may need an injection of glucagon. A family member or close friend should learn how to check your blood glucose and how to give you a glucagon injection. Ask your health care provider if you need to have an emergency glucagon injection kit available. Severe hypoglycemia may need to be treated in a hospital. The treatment may  include getting glucose through an IV. You may also need treatment for the cause of your hypoglycemia. Follow these instructions at home: Take diabetes medicines as told  If your health care provider prescribed insulin or diabetes medicines, take them every day.  Do not run out of insulin or  other diabetes medicines that you take. Plan ahead so you always have these available.  If you use insulin, adjust your dosage based on how physically active you are and what foods you eat. Your health care provider will tell you how to adjust your dosage. Make healthy food choices  The things that you eat and drink affect your blood glucose (and your insulin dose, if this applies). Making good choices helps to control your diabetes and prevent other health problems. A healthy meal plan includes eating lean proteins, complex carbohydrates, fresh fruits and vegetables, low-fat dairy products, and healthy fats. Make an appointment to see a diet and nutrition specialist (registered dietitian) to help you create an eating plan that is right for you. Make sure that you:  Follow instructions from your health care provider about eating or drinking restrictions.  Drink enough fluid to keep your urine pale yellow.  Eat healthy snacks between nutritious meals.  Keep a record of the carbohydrates that you eat. Do this by reading food labels and learning the standard serving sizes of foods.  Follow your sick day plan whenever you cannot eat or drink as usual. Make this plan in advance with your health care provider.  Stay active  Do 30 or more minutes of physical activity a day, or as much physical activity as your health care provider recommends during your pregnancy. ? Doing 10 minutes of exercise starting 30 minutes after each meal may help to control postprandial blood glucose levels.  If you start a new exercise or activity, work with your health care provider to adjust your insulin, medicines, or food intake as needed. Make healthy lifestyle choices  Do not drink alcohol.  Do not use any tobacco products, such as cigarettes, chewing tobacco, and e-cigarettes. If you need help quitting, ask your health care provider.  Learn to manage stress. If you need help with this, ask your health care  provider. Care for your body  Keep your immunizations up to date.  Brush your teeth and gums two times a day, and floss one or more times a day. Visit your dentist one or more times every 6 months.  Maintain a healthy weight during your pregnancy. General instructions  Take over-the-counter and prescription medicines only as told by your health care provider.  Talk with your health care provider about your risk for high blood pressure during pregnancy (preeclampsia or eclampsia).  Share your diabetes management plan with people in your workplace, school, and household.  Check your urine for ketones during your pregnancy when you are ill and as told by your health care provider.  Carry a medical alert card or wear medical alert jewelry that says you have gestational diabetes.  Keep all follow-up visits during your pregnancy (prenatal) and after delivery (postnatal) as told by your health care provider. This is important. Get the care that you need after delivery  Have your blood glucose level checked 4-12 weeks after delivery. This is done with an oral glucose tolerance test (OGTT).  Get screened for diabetes at least every 3 years, or as often as told by your health care provider. Questions to ask your health care provider  Do I need to  meet with a diabetes educator?  Where can I find a support group for people with gestational diabetes? Where to find more information For more information about gestational diabetes, visit:  American Diabetes Association (ADA): www.diabetes.org  Centers for Disease Control and Prevention (CDC): http://www.wolf.info/ Summary  Check your blood glucose every day during your pregnancy. Do this as often as told by your health care provider.  If your health care provider prescribed insulin or diabetes medicines, take them every day as told.  Keep all follow-up visits during your pregnancy (prenatal) and after delivery (postnatal) as told by your health care  provider. This is important.  Have your blood glucose level checked 4-12 weeks after delivery. This information is not intended to replace advice given to you by your health care provider. Make sure you discuss any questions you have with your health care provider. Document Released: 11/12/2015 Document Revised: 01/11/2018 Document Reviewed: 08/24/2015 Elsevier Interactive Patient Education  2019 [place] [phone]) . [facility] Davy Pique, MD; Gwendlyn Deutscher, MD; Walker Kehr, MD; Andria Frames, MD; McDiarmid, MD; Dutch Quint, MD; Nori Riis, MD; [place], [address] [place], [place] [address] o [phone] o Mon-Fri 8:30-12:30, 1:30-5:00 o Providers come to see babies at [facility] o Accepting Medicaid . [facility] [place] providers who accept newborns: Dorthy Cooler, MD; Orland Mustard, MD; Stephanie Acre, MD o [place], [place], [place] [address] o [phone] o Mon-Fri 8:00-5:30 o Babies seen by providers at [facility] o Does NOT accept Medicaid o Please call early in hospitalization for appointment (limited availability)  . Mustard Cygnet, MD o [address], [place], [place] [address] o [phone] o Mon, Tue, Thur, Fri 8:30-5:00, Wed 10:00-7:00 (closed 1-2pm) o Babies seen by [facility] providers o Accepting Medicaid . [place], MD o [place], [place], [place] [address] o [phone] o Mon-Fri 8:30-5:00, Sat 8:30-12:00 o Provider comes to see babies at [facility] Medicaid o Must have been referred from current patients or contacted office prior to delivery . [facility] [facility] [place]) Franne Forts, MD; Tamera Punt, MD; Doneen Poisson, MD; Fatima Sanger, MD; Wynetta Emery, MD; Jess Barters, MD; Tami Ribas, MD; Herbert Moors, MD; Derrell Lolling, MD;  Dorothyann Peng, MD; Lucious Groves, NP; Baldo Ash, NP o [address], [place], [place] [address] o [phone] o Mon, Tue, Thur, Fri 8:30-5:30, Wed 9:30-5:30, Sat 8:30-12:30 o Babies seen by [facility] providers o Accepting Medicaid o Only accepting infants of first-time parents or siblings of current patients [facility] discharge coordinator will make follow-up appointment . Baltazar Najjar o [place] [address], [place], [place]  [address] o [phone]   Fax - [phone] . [facility] o [address], [place], [place]  [address] o Phone - [phone]   Fax [phone] . [place], [place], [place], [place]  [address] o [phone]  [place] [phone]) . [facility] Reginal Lutes, MD; Jacklynn Ganong, MD; Torrie Mayers, MD; MD; Rosana Hoes, MD; Servando Salina, MD; Rose Fillers, MD; Rex Kras, MD; Corinna Capra, MD; Volney American, MD; Trilby Drummer, MD; Janann Colonel, MD; Jimmye Norman, [place], [place],  [address] o [phone] o Mon-Fri 8:30-5:00 (extended evenings Mon-Thur as needed), Sat-Sun 10:00-1:00 o Providers come to see babies at [place] Medicaid for families of first-time babies and families with all children in the household  age 64 and under. Must register with office prior to making appointment (M-F only). . Mak[place], NP; Tomi Bamberger, MD; Redmond School, MD; [place], [address] [place], [place] [address] o [phone] o Mon-Fri 8:00-5:00 o Babies seen by providers at [facility] o Does NOT accept Medicaid/Commercial Insurance Only . [facility] [place] [facility]  Marnee Guarneri, MD; Drema Dallas, MD; Montine Circle, MD; Vilma Prader, MD; Vanita Panda, MD; Alfonso Ramus, MD; Ruthann Cancer, MD; Roxanne Mins, MD; Rosalva Ferron, MD; Polly Cobia, MD o [address] [place], [place] [address] o [phone] o Mon-Fri 8:30-5:30, Sat (Oct.-Mar.) 9:00-1:00 o Babies seen by providers at [place] [phone]) . [facility] Peers, MD; Suzan Slick, MD o [address], [place], [place] [address] o [phone] o Mon-Fri 8:30-5:00, Sat 8:30-12:00 o Providers come to see babies at [facility] o Does NOT accept Medicaid . [place] at [place], [place]; [place], MD; [place], [place]; Nancy Fetter, MD; Moreen Fowler, [place], [place], [place] [address] o [phone] o Mon-Fri 8:00-5:00 o Babies seen by providers at [facility] o Does NOT accept Medicaid o Only accepting babies of parents who are patients o Please call early in hospitalization for appointment (limited availability) . [facility] Blanca Friend, MD; Sharlene Motts, MD; Rod Can, MD; Warner Mccreedy, NP; Sabra Heck, MD; Ermalinda Memos, MD; Sharlett Iles, NP; Aurther Loft, MD; Jerrye Beavers, MD; Marcello Moores, MD; Berline Lopes, MD; Charolette Forward, MD o [address] [place], [place], [place] [address] o [phone] o Mon-Fri 8:00-5:00, Sat 9:00-12:00 o Providers come to see babies at [facility] o Does NOT accept [facility] [phone]) . [place] [facility] [place] providers accepting new patients: Dayna Ramus, NP; [place], [place], [place], [place] [address] o [phone] o Mon-Fri 8:00-5:00 o Babies seen by providers at [facility] o Does NOT accept Medicaid o Only accepting babies of parents who are patients o Please call early in hospitalization for appointment (limited availability) . [facility] Oswaldo Conroy, MD; Sheran Lawless, MD o [address] [place], [place] [address] o [phone] (press 1 to schedule appointment) o Mon-Fri 8:00-5:00 o Providers come to see babies at [facility] o Does NOT accept Medicaid . [facility] Jodi Mourning, MD o [address] [place], [place] [address] o [phone] o Mon-Fri 8:30-5:00 (lunch 12:30-1:00), extended hours by appointment only Wed 5:00-6:30 o Babies seen  by [facility] providers o Accepting Medicaid . [place] at Evalyn Casco, MD; [place], MD; Ethlyn Gallery, MD o [place], [place], [place] [address] o [phone] o Mon-Fri 8:00-5:00 o Babies seen by [facility] providers o Does NOT accept Medicaid . Therapist, music at [place], MD; Yong Channel, MD; [place], [place] [address] [place], [place] [address] o [phone] o Mon-Fri 8:00-5:00 o Babies seen by [facility] providers o Does NOT accept Medicaid . [place], [place]; [place], [place]; [place], NP; Albertina Parr, MD; Frederic Jericho, MD; Ronney Lion, MD; Carlos Levering, NP; Jerelene Redden, NP; Tomasita Crumble, NP; Ronelle Nigh, NP; Corinna Lines, MD; [place], MD o [address] [place], [place] [address] o [phone] o Mon-Fri 8:30-5:00, Sat 10:00-1:00 o Providers come to see babies at [facility] o Does NOT accept Medicaid o Free prenatal information session Tuesdays at 4:45pm . [facility] Porfirio Oar, MD; [place], [place]; [place], [place]; [place], [place]., [place] [address] o [phone] o Mon-Fri 7:30-5:30 o  Babies seen by [facility] providers . [facility] o [address] [place], [place], [place]  [address] o [phone]   Fax - [phone]  [place] [phone] & [phone]) . [place], MD o [address] [place], [place] [address] o [phone] o Mon-Thur 8:00-6:00 o Providers come to see babies at [place] Medicaid . [place], NP; Melford Aase, MD; [place], [place]; [place], [address] [place], [place] [address] o [phone] o Mon-Thur 7:30-7:30, Fri 7:30-4:30 o Babies seen by [facility] providers o Accepting Medicaid . [facility] Nyra Jabs, MD; Cristino Martes, NP; Gertie Baron, MD o [place] [address], [place], [place] [address] o [phone] o Mon-Fri 8:30-5:00, Sat 8:30-12:00  o Providers come to see babies at [place] Medicaid o Must have "Meet & Greet" appointment at office prior to delivery . [place] ([place]) Jodene Nam, MD; Juleen China, MD; Clydene Laming, [place] [address], [place] [address], [place] [address] o [phone] o Mon-Wed 8:00-6:00, Thur-Fri 8:00-5:00, Sat 9:00-12:00 o Providers come to see babies at [facility] o Does NOT accept Medicaid o Only accepting siblings of current patients . [facility] [place], [place], [place], [place]  [address] o [phone]   Fax [phone] . [place] at [place] [address] [place], [place]  [address] o [phone]   Fax - [place] [phone] & [phone]) . Therapist, music at [place], DO; [place], [place] [address] [place], [place] [address] o [phone] o Mon-Fri 7:00-5:00 o Babies seen by [facility] providers o Does NOT accept Medicaid . [place], MD; [place], [place]; [place], [place], [place], [place] [address] o [phone] o Mon-Fri 8:00-5:00 o Babies seen by [facility] providers o Accepting Medicaid . [place], MD; [place], [place]; [place], NP; [place], [place], [place], [place] [address] o [phone] o Mon-Fri 8:00-5:00 o Babies seen by providers at [place] [phone]) . [facility] [place], [place] o [address] [place], [place] [address] o [phone] o Mon-Fri 8:00-5:00 o Babies seen by [facility] providers o Does NOT accept Medicaid o Limited availability, please call early in hospitalization to schedule follow-up . [facility] Leilani Merl,  PA; Maisie Fus, MD; [place], MD; [place], [place]; Jeannine Kitten, MD; [place], [place] [facility] [address], [place], [place] [address] o [phone] o Mon-Fri 8:30-5:00, Sat 9:00-12:00 o Babies seen by providers at [facility] o Accepting Medicaid o Please register online then schedule online or call office o [email or web address] . [place] ([place] [facility] [place]) Kristian Covey, NP; Dwyane Dee, MD; Leonidas Romberg, PA o [address] [place], [place], [place] [address] o [phone] o Mon-Fri 8:00-5:00 o Babies seen by providers at [facility] o Accepting Medicaid . [place] ([facility] [employer]) Dairl Ponder, MD; Rayvon Char, NP; Melina Modena, MD o [address] [place], [place], [place] [address] o [phone] o Mon-Fri 8:00-5:30, Sat&Sun by appointment (phones open at 8:30) o Babies seen by [facility] providers o Accepting Medicaid o Must be a first-time baby or sibling of current  patient . [place], [address], [place], [place]  [address] o [phone]   Fax - [phone]  [place] [phone] & [phone]) . [place], [place]; [place], [place]; Benjamine Mola, MD; [place], [place]; Harrell Lark, MD o [address], [place], [place] [address] o [phone] o Mon-Thur 8:00-7:00, Fri 8:00-5:00, Sat 8:00-12:00, Sun 9:00-12:00 o Babies seen by [facility] providers o Accepting Medicaid . [facility], MD; Ruthann Cancer, MD; [facility], MD o [address], [place], [place] [address] o [phone] o Mon-Thur 8:00-5:00 o Babies seen by providers at [facility] o Accepting Medicaid . [facility] [place], MD; Coe[place]Go[place], MD; Amedeo Plenty, MD; Bobby Rumpf, MD; List, MD; Lavonia Drafts, MD; Ruthann Cancer, MD; Selinda Eon, MD; Audie Box, MD; Jim Like, MD; Christie Nottingham, MD; Hubbard Hartshorn, MD; [place] Nunnery,  MD o [address] [place], [place] [address] o [phone] o Mon-Fri 8:00-5:30, Sat (Oct.-Mar.) 9:00-1:00 o Babies seen by providers at [facility] o Accepting Medicaid o Must fill out new patient packet, available online at [email or web address] . [place] ([facility]) Barnabas Lister, NP; Kenton Kingfisher, NP; Claiborne Billings, NP; Rolla Plate, MD; [place], [place]; Carola Rhine, MD; Tyron Russell, MD; Delia Chimes, NP o [address], [place], [place] [address] o [phone] o Mon-Thur 8:00-5:30, Fri 8:00-5:00 o Babies seen by providers at [place] [phone]) . Rafael Capo, [place]; [place], MD; Dennard Schaumann, MD; [place], [place] o [address] [place], [place] [address] o [phone] o Mon-Fri 8:00-5:00 o Babies seen by providers at [place] [phone]) . [place] at [place], [place]; Olen Pel, MD; [place], [place], [place], [place] [address] o [phone] o Mon-Fri 8:00-5:00 o Babies seen by providers at [facility] o Does NOT accept Medicaid o Limited appointment availability, please call early in hospitalization  . Therapist, music at [place], [place]; [place], [address], [place], [place] [address] o [phone] o Mon-Fri 8:00-5:00 o Babies seen by [facility] providers o Does NOT accept Medicaid . [facility] Su Grand, MD; Guy Sandifer, MD; [place], [place]; [place], [place] [address], [place], [place] [address] o [phone] o Mon-Fri 8:00-5:00 o After hours clinic [facility] [place], [place] [address]) [phone] Mon-Fri 5:00-8:00, Sat 12:00-6:00, Sun 10:00-4:00 o Babies seen by [facility] providers o Accepting Medicaid . [place] at [facility] o [address], [place], [place]  [address] o [phone]   Fax -  [phone]  [place] [phone]) . Therapist, music at [facility], MD o [address] [place], [place], [place] [address] o [phone] o Mon-Fri 8:00-5:00 o Babies seen by [facility] providers o Does NOT accept Medicaid . [place] ([place] [facility] [place]) Bing Neighbors, MD o [address] [place], [place], [place] [address] o [phone] o Mon-Thur 8:00-7:00, Fri 8:00-5:00, Sat 8:00-12:00 o Babies seen by providers at [facility] o Accepting Medicaid - but does not have vaccinations in office (must be received elsewhere) o Limited availability, please call early in hospitalization  [employer] ([id number]) . [place], MD o [address] [place] [address] o  [phone]  Fax [phone]

## 2019-02-08 ENCOUNTER — Encounter: Payer: Self-pay | Admitting: *Deleted

## 2019-02-11 ENCOUNTER — Other Ambulatory Visit: Payer: Self-pay

## 2019-02-11 ENCOUNTER — Encounter (HOSPITAL_COMMUNITY): Payer: Self-pay | Admitting: *Deleted

## 2019-02-11 ENCOUNTER — Inpatient Hospital Stay (HOSPITAL_COMMUNITY)
Admission: AD | Admit: 2019-02-11 | Discharge: 2019-02-11 | Disposition: A | Discharge status: Home / Self Care | Payer: BC Managed Care – PPO | Attending: Obstetrics and Gynecology | Admitting: Obstetrics and Gynecology

## 2019-02-11 ENCOUNTER — Inpatient Hospital Stay (HOSPITAL_BASED_OUTPATIENT_CLINIC_OR_DEPARTMENT_OTHER): Payer: BC Managed Care – PPO

## 2019-02-11 DIAGNOSIS — Z87891 Personal history of nicotine dependence: Secondary | ICD-10-CM | POA: Diagnosis not present

## 2019-02-11 DIAGNOSIS — Z3689 Encounter for other specified antenatal screening: Secondary | ICD-10-CM | POA: Diagnosis not present

## 2019-02-11 DIAGNOSIS — O368130 Decreased fetal movements, third trimester, not applicable or unspecified: Secondary | ICD-10-CM | POA: Diagnosis not present

## 2019-02-11 DIAGNOSIS — Z3A36 36 weeks gestation of pregnancy: Secondary | ICD-10-CM | POA: Insufficient documentation

## 2019-02-11 HISTORY — DX: Gestational diabetes mellitus in pregnancy, unspecified control: O24.419

## 2019-02-11 LAB — URINALYSIS, ROUTINE W REFLEX MICROSCOPIC
Bilirubin Urine: NEGATIVE
Glucose, UA: NEGATIVE mg/dL
Hgb urine dipstick: NEGATIVE
Ketones, ur: NEGATIVE mg/dL
Nitrite: NEGATIVE
Protein, ur: NEGATIVE mg/dL
Specific Gravity, Urine: 1.016 (ref 1.005–1.030)
pH: 7 (ref 5.0–8.0)

## 2019-02-11 IMAGING — US US MFM FETAL BPP WO NON STRESS
1 series · 15 of 28 positions shown · non-contrast
Comparison: none

[Series 1: us mfm fetal bpp wo non stress · 30 acquisitions, 15 frames shown]
[im 1/30]
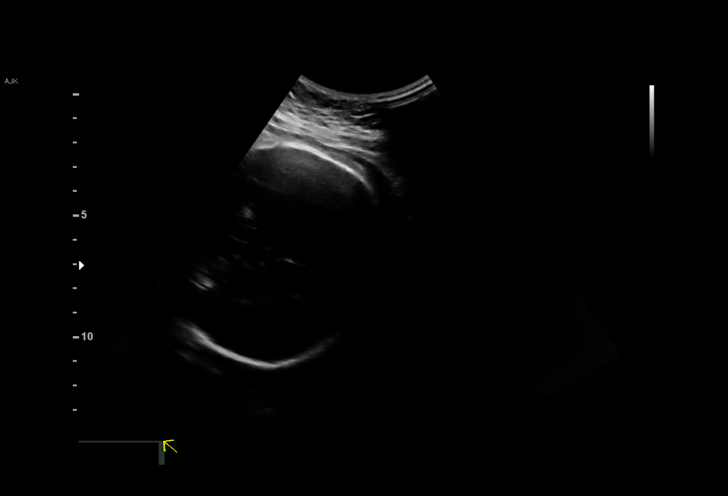
[im 3/30]
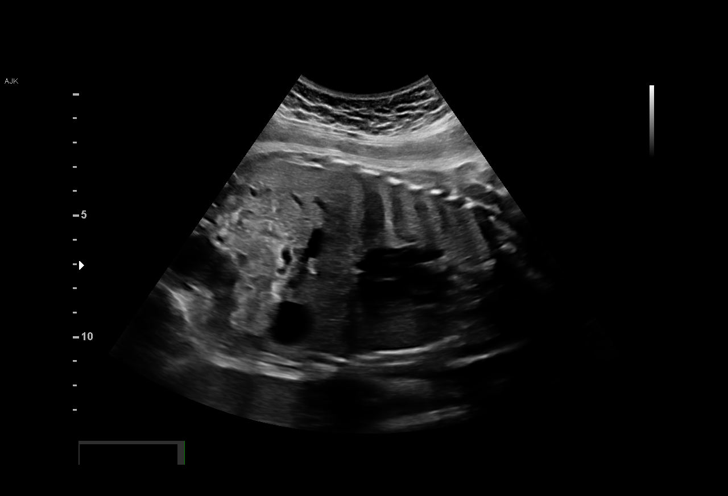
[im 5/30]
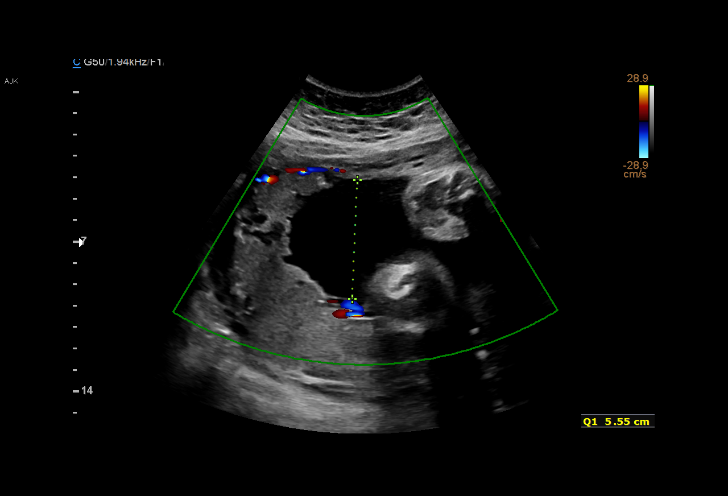
[im 7/30]
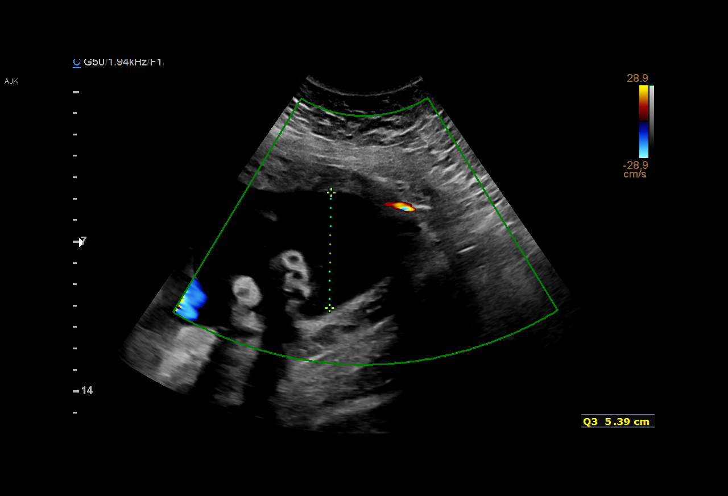
[im 9/30]
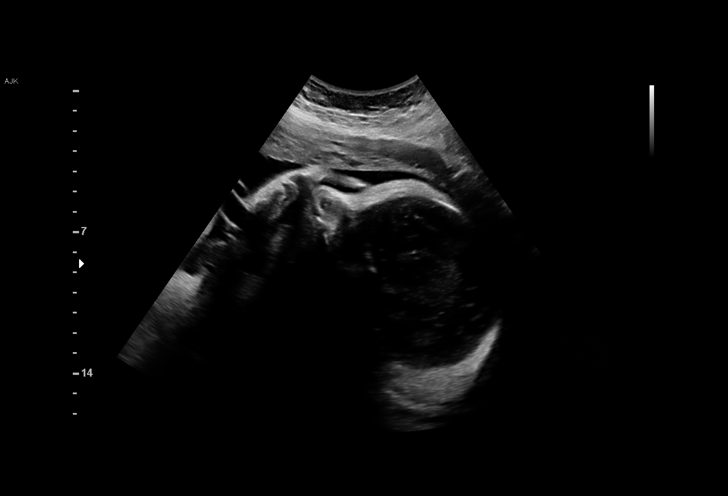
[im 11/30]
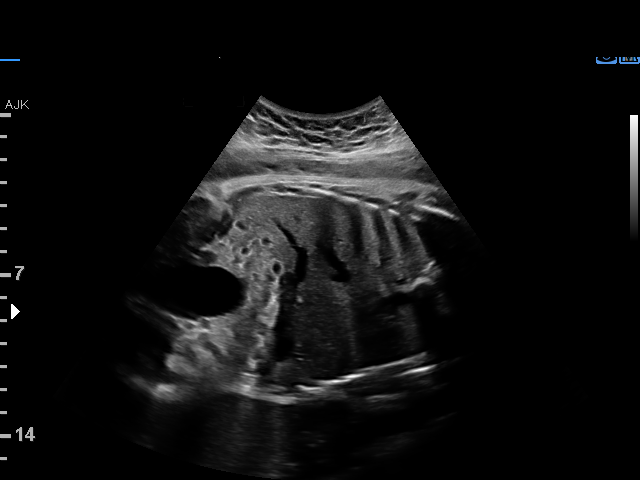
[im 13/30]
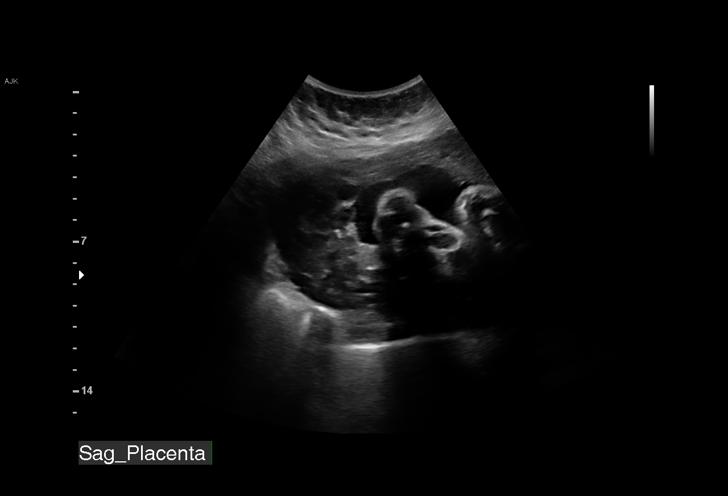
[im 16/30]
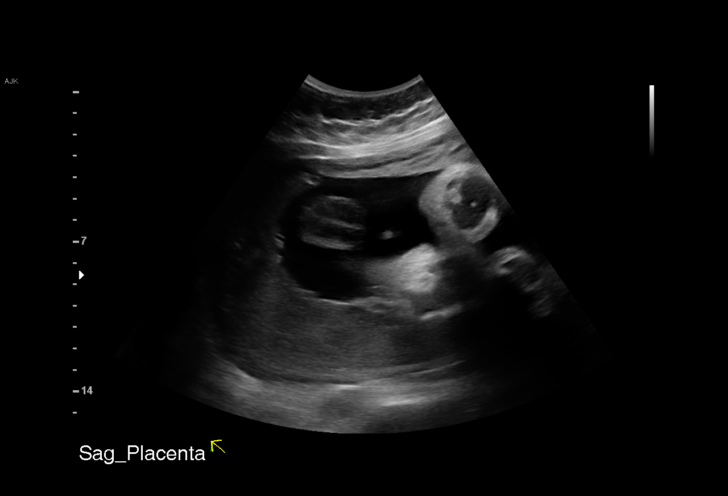
[im 17/30]
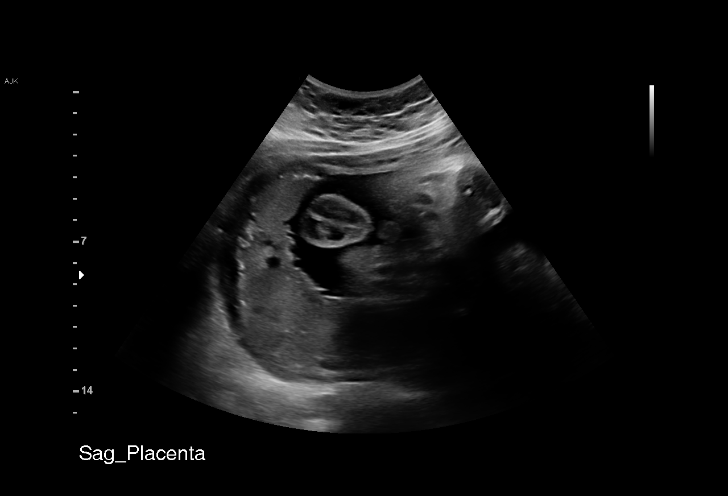
[im 19/30]
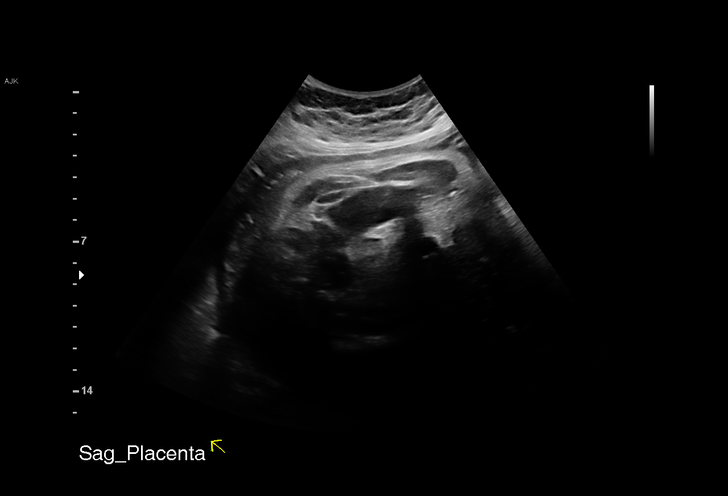
[im 21/30]
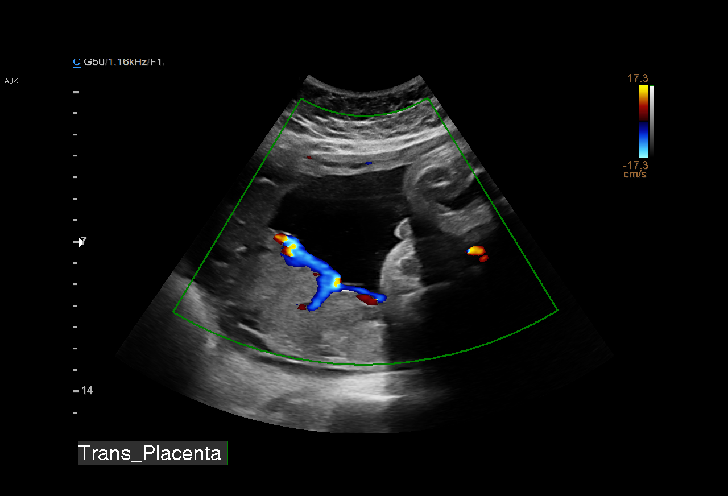
[im 23/30]
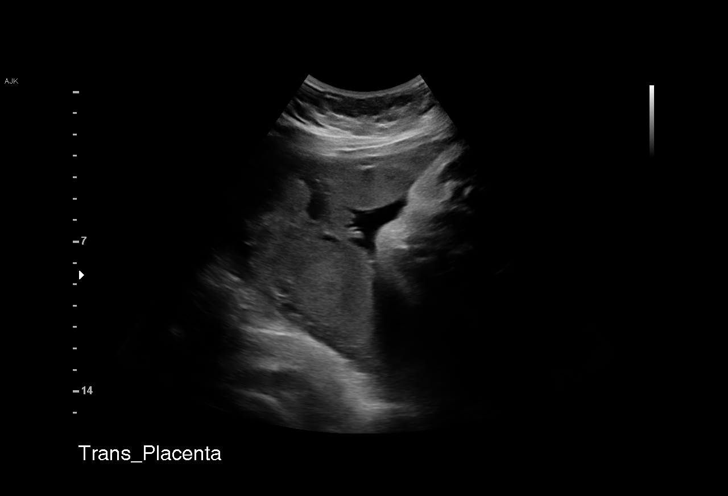
[im 25/30]
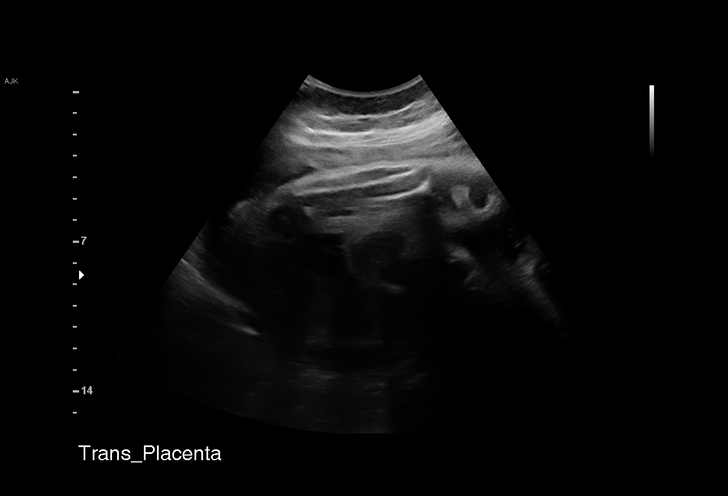
[im 27/30]
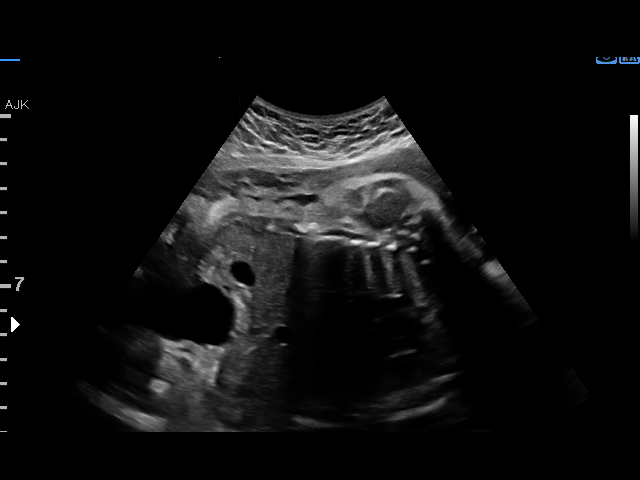
[im 30/30]
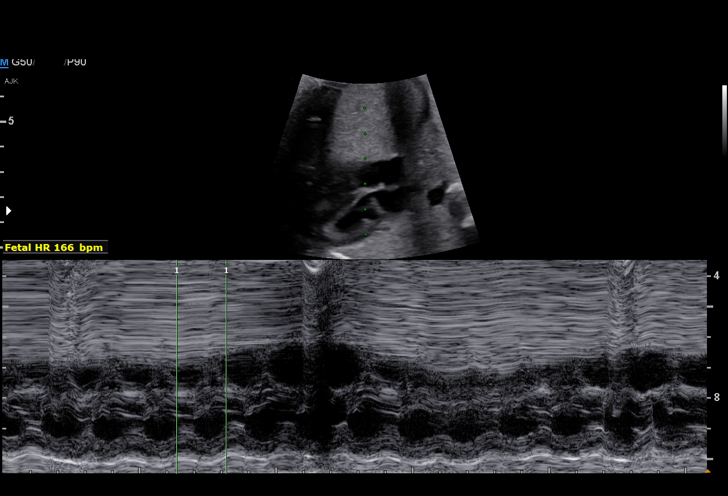

[15 of 28 positions shown; findings below may reference images not displayed]

----------------------------------------------------------------------

 ----------------------------------------------------------------------
Indications

  Decreased fetal movement                       [WX]
  36 weeks gestation of pregnancy
 ----------------------------------------------------------------------
Fetal Evaluation

 Num Of Fetuses:         1
 Fetal Heart Rate(bpm):  166
 Cardiac Activity:       Observed
 Presentation:           Cephalic
 Placenta:               Posterior
 P. Cord Insertion:      Visualized

 Amniotic Fluid
 AFI FV:      Within normal limits

 AFI Sum(cm)     %Tile       Largest Pocket(cm)
 15.48           58

 RUQ(cm)       RLQ(cm)       LUQ(cm)        LLQ(cm)


 Comment:    Diaphragm, stomach, and bladder seen.
Biophysical Evaluation

 Amniotic F.V:   Within normal limits       F. Tone:        Observed
 F. Movement:    Observed                   Score:          [DATE]
 F. Breathing:   Observed
Gestational Age

 Clinical EDD:  36w 4d                                        EDD:   [DATE]
 Best:          36w 4d     Det. By:  Clinical EDD             EDD:   [DATE]
Impression

 Patient was evaluated for c/o decreased fetal movements.
 Amniotic fluid is normal and good fetal activity is seen.
 Antenatal testing is reassuring. BPP [DATE].
                 MALIHA

## 2019-02-11 NOTE — MAU Note (Signed)
Been having reduced movement the last 4 days, today it was low enough she felt she needed to come in.  Been having HA the past 4 days, only goes away with Tylenol.  Noted an increase in d/c the past 2 days. Increase in swelling hands and feet.

## 2019-02-11 NOTE — MAU Provider Note (Signed)
History   [id number]   Chief Complaint  Patient presents with  . Decreased Fetal Movement    HPI Shelby Hayden is a 23 y.o. female  G1P0000 here with report of decreased fetal movement since 4 days ago. Reports "lots" of movement today but wanted to come & make sure baby was ok since it's been less than usual this week. Denies contractions, abdominal pain, vaginal bleeding, or LOF. Does reports some increase LE swelling this week & intermittent headaches. Currently no headache, visual disturbance or epigastric pain. Denies history of hypertension.   Patient's last menstrual period was 05/08/2018 (exact date).  OB History  Gravida Para Term Preterm AB Living  1 0 0 0 0    SAB TAB Ectopic Multiple Live Births  0 0 0        # Outcome Date GA Lbr Len/2nd Weight Sex Delivery Anes PTL Lv  1 Current             Past Medical History:  Diagnosis Date  . Anxiety   . Chronic rhinitis 10/16/2016  . Depression   . Eczema   . GERD (gastroesophageal reflux disease)   . Gestational diabetes   . History of food allergy 10/16/2016  . Medical history non-contributory   . Recurrent headaches 10/16/2016  . Seborrheic dermatitis 10/16/2016    Family History  Problem Relation Age of Onset  . Cancer Father        prostate  . Alzheimer's disease Maternal Grandfather   . Diabetes Paternal Grandfather   . Asthma Sister   . Allergic rhinitis Sister   . Asthma Maternal Grandmother   . Asthma Paternal Grandmother     Social History   Socioeconomic History  . Marital status: Single    Spouse name: Not on file  . Number of children: Not on file  . Years of education: Not on file  . Highest education level: Not on file  Occupational History  . Not on file  Social Needs  . Financial resource strain: Not on file  . Food insecurity    Worry: Not on file    Inability: Not on file  . Transportation needs    Medical: Not on file    Non-medical: Not on file  Tobacco Use  . Smoking status: Former  Smoker    Packs/day: 1.00    Types: Cigarettes  . Smokeless tobacco: Never Used  . Tobacco comment: none since pregnancy  Substance and Sexual Activity  . Alcohol use: Not Currently  . Drug use: Never  . Sexual activity: Not on file  Lifestyle  . Physical activity    Days per week: Not on file    Minutes per session: Not on file  . Stress: Not on file  Relationships  . Social Musicianconnections    Talks on phone: Not on file    Gets together: Not on file    Attends religious service: Not on file    Active member of club or organization: Not on file    Attends meetings of clubs or organizations: Not on file    Relationship status: Not on file  Other Topics Concern  . Not on file  Social History Narrative   ** Merged History Encounter **        Allergies  Allergen Reactions  . Latex Rash  . Amoxicillin Rash    No current facility-administered medications on file prior to encounter.    Current Outpatient Medications on File Prior to Encounter  Medication  Sig Dispense Refill  . acetaminophen (TYLENOL) 500 MG tablet Take 1,000 mg by mouth every 6 (six) hours as needed for mild pain.    . calcium carbonate (TUMS - DOSED IN MG ELEMENTAL CALCIUM) 500 MG chewable tablet Chew 1 tablet by mouth as needed for indigestion or heartburn.    . Prenatal Vit-Fe Fumarate-FA (PRENATAL MULTIVITAMIN) TABS tablet Take 1 tablet by mouth daily at 12 noon.       Review of Systems  Constitutional: Negative.   Eyes: Negative for visual disturbance.  Cardiovascular: Positive for leg swelling.  Gastrointestinal: Negative.   Genitourinary: Negative.   Neurological: Positive for headaches (not currently).     Physical Exam   Vitals:   02/11/19 1427 02/11/19 1444 02/11/19 1716  BP: 120/81 114/66 111/66  Pulse: 86  87  Resp: (!) 98  16  TempSrc: Oral    SpO2: 100%    Weight: 90.9 kg      Physical Exam  Nursing note and vitals reviewed. Constitutional: She is oriented to person, place, and  time. She appears well-developed and well-nourished. No distress.  HENT:  Head: Normocephalic and atraumatic.  Eyes: Conjunctivae are normal. Right eye exhibits no discharge. Left eye exhibits no discharge. No scleral icterus.  Neck: Normal range of motion.  Respiratory: Effort normal. No respiratory distress.  Musculoskeletal:        General: No edema.  Neurological: She is alert and oriented to person, place, and time.  Skin: Skin is warm and dry. She is not diaphoretic.  Psychiatric: She has a normal mood and affect. Her behavior is normal. Judgment and thought content normal.   NST:  Baseline: 140 bpm, Variability: Good {> 6 bpm), Accelerations: Reactive and Decelerations: Absent  MAU Course  Procedures Results for orders placed or performed during the hospital encounter of 02/11/19 (from the past 24 hour(s))  Urinalysis, Routine w reflex microscopic     Status: Abnormal   Collection Time: 02/11/19  3:33 PM  Result Value Ref Range   Color, Urine YELLOW YELLOW   APPearance HAZY (A) CLEAR   Specific Gravity, Urine 1.016 1.005 - 1.030   pH 7.0 5.0 - 8.0   Glucose, UA NEGATIVE NEGATIVE mg/dL   Hgb urine dipstick NEGATIVE NEGATIVE   Bilirubin Urine NEGATIVE NEGATIVE   Ketones, ur NEGATIVE NEGATIVE mg/dL   Protein, ur NEGATIVE NEGATIVE mg/dL   Nitrite NEGATIVE NEGATIVE   Leukocytes,Ua LARGE (A) NEGATIVE   RBC / HPF 0-5 0 - 5 RBC/hpf   WBC, UA 11-20 0 - 5 WBC/hpf   Bacteria, UA FEW (A) NONE SEEN   Squamous Epithelial / LPF 11-20 0 - 5   Mucus PRESENT     MDM Reactive NST with active fetal movement noted.  BPP 8/8, normal AFI Patient normotensive & reassured with vital signs.    Assessment and Plan  A:  1. Decreased fetal movement, third trimester, not applicable or unspecified fetus   2. [redacted] weeks gestation of pregnancy   3. NST (non-stress test) reactive    P: Discharge home Discussed reasons to return to MAU Keep f/u with ob/gyn   Jorje Guild, NP 02/12/2019  9:10 AM

## 2019-02-11 NOTE — Discharge Instructions (Signed)

## 2019-02-15 ENCOUNTER — Encounter (HOSPITAL_COMMUNITY): Payer: Self-pay | Admitting: *Deleted

## 2019-02-15 ENCOUNTER — Ambulatory Visit (HOSPITAL_COMMUNITY)
Admission: RE | Admit: 2019-02-15 | Discharge: 2019-02-15 | Disposition: A | Discharge status: Home / Self Care | Payer: BC Managed Care – PPO | Source: Ambulatory Visit | Attending: Obstetrics and Gynecology | Admitting: Obstetrics and Gynecology

## 2019-02-15 ENCOUNTER — Other Ambulatory Visit: Payer: Self-pay

## 2019-02-15 ENCOUNTER — Ambulatory Visit (HOSPITAL_COMMUNITY): Payer: BC Managed Care – PPO | Admitting: *Deleted

## 2019-02-15 VITALS — BP 128/89 | HR 90 | Temp 98.8°F

## 2019-02-15 DIAGNOSIS — Z3A37 37 weeks gestation of pregnancy: Secondary | ICD-10-CM | POA: Diagnosis not present

## 2019-02-15 DIAGNOSIS — O24410 Gestational diabetes mellitus in pregnancy, diet controlled: Secondary | ICD-10-CM | POA: Diagnosis not present

## 2019-02-15 DIAGNOSIS — O99213 Obesity complicating pregnancy, third trimester: Secondary | ICD-10-CM

## 2019-02-15 DIAGNOSIS — O99333 Smoking (tobacco) complicating pregnancy, third trimester: Secondary | ICD-10-CM | POA: Diagnosis not present

## 2019-02-17 ENCOUNTER — Ambulatory Visit (INDEPENDENT_AMBULATORY_CARE_PROVIDER_SITE_OTHER): Payer: BC Managed Care – PPO | Admitting: Obstetrics & Gynecology

## 2019-02-17 ENCOUNTER — Encounter: Payer: Self-pay | Admitting: *Deleted

## 2019-02-17 ENCOUNTER — Other Ambulatory Visit: Payer: Self-pay

## 2019-02-17 ENCOUNTER — Other Ambulatory Visit (HOSPITAL_COMMUNITY)
Admission: RE | Admit: 2019-02-17 | Discharge: 2019-02-17 | Disposition: A | Discharge status: Home / Self Care | Payer: BC Managed Care – PPO | Source: Ambulatory Visit | Attending: Obstetrics & Gynecology | Admitting: Obstetrics & Gynecology

## 2019-02-17 VITALS — BP 126/80 | HR 84 | Wt 202.6 lb

## 2019-02-17 DIAGNOSIS — Z3A37 37 weeks gestation of pregnancy: Secondary | ICD-10-CM

## 2019-02-17 DIAGNOSIS — Z3A36 36 weeks gestation of pregnancy: Secondary | ICD-10-CM

## 2019-02-17 DIAGNOSIS — O99820 Streptococcus B carrier state complicating pregnancy: Secondary | ICD-10-CM

## 2019-02-17 DIAGNOSIS — O0993 Supervision of high risk pregnancy, unspecified, third trimester: Secondary | ICD-10-CM

## 2019-02-17 LAB — OB RESULTS CONSOLE GBS: GBS: POSITIVE

## 2019-02-17 NOTE — Progress Notes (Signed)
   PRENATAL VISIT NOTE  Subjective:  Shelby Hayden is a 23 y.o. G1P0000 at [redacted]w[redacted]d being seen today for ongoing prenatal care.  She is currently monitored for the following issues for this high-risk pregnancy and has Diet controlled gestational diabetes mellitus in third trimester; Depression; Smoker; and Supervision of high-risk pregnancy, third trimester on their problem list.  Patient reports no complaints.  Contractions: Not present. Vag. Bleeding: None.  Movement: Present. Denies leaking of fluid.   The following portions of the patient's history were reviewed and updated as appropriate: allergies, current medications, past family history, past medical history, past social history, past surgical history and problem list.   Objective:   Vitals:   02/17/19 1609  BP: 126/80  Pulse: 84  Weight: 202 lb 9.6 oz (91.9 kg)    Fetal Status: Fetal Heart Rate (bpm): 144   Movement: Present  Presentation: Vertex  General:  Alert, oriented and cooperative. Patient is in no acute distress.  Skin: Skin is warm and dry. No rash noted.   Cardiovascular: Normal heart rate noted  Respiratory: Normal respiratory effort, no problems with respiration noted  Abdomen: Soft, gravid, appropriate for gestational age.  Pain/Pressure: Present     Pelvic: Cervical exam performed Dilation: Fingertip Effacement (%): 30 Station: -3  Extremities: Normal range of motion.  Edema: Trace  Mental Status: Normal mood and affect. Normal behavior. Normal judgment and thought content.   Assessment and Plan:  Pregnancy: G1P0000 at [redacted]w[redacted]d 1. [redacted] weeks gestation of pregnancy routine labs - Culture, beta strep (group b only) - GC/Chlamydia probe amp ([place])not at [place] control is good Preterm labor symptoms and general obstetric precautions including but not limited to vaginal bleeding, contractions, leaking of fluid and fetal movement were reviewed in detail with the patient. Please refer to After Visit Summary for  other counseling recommendations.   Return in about 1 week (around 02/24/2019) for virtual.  Future Appointments  Date Time Provider [place]  02/24/2019  1:15 PM Lavonia Drafts, MD [facility] [place]  03/03/2019  1:15 PM Lavonia Drafts, MD [facility]  03/08/2019 10:55 AM Sloan Leiter, MD [facility] [place]  03/10/2019  1:15 PM Woodroe Mode, MD [facility]    Emeterio Reeve, MD

## 2019-02-17 NOTE — Patient Instructions (Signed)

## 2019-02-20 LAB — CULTURE, BETA STREP (GROUP B ONLY): Strep Gp B Culture: POSITIVE — AB

## 2019-02-22 LAB — GC/CHLAMYDIA PROBE AMP (~~LOC~~) NOT AT ARMC
Chlamydia: NEGATIVE
Neisseria Gonorrhea: NEGATIVE

## 2019-02-23 DIAGNOSIS — O99820 Streptococcus B carrier state complicating pregnancy: Secondary | ICD-10-CM | POA: Insufficient documentation

## 2019-02-24 ENCOUNTER — Other Ambulatory Visit: Payer: Self-pay

## 2019-02-24 ENCOUNTER — Encounter: Payer: Self-pay | Admitting: Obstetrics & Gynecology

## 2019-02-24 ENCOUNTER — Telehealth (INDEPENDENT_AMBULATORY_CARE_PROVIDER_SITE_OTHER): Payer: BC Managed Care – PPO | Admitting: Obstetrics & Gynecology

## 2019-02-24 DIAGNOSIS — O99820 Streptococcus B carrier state complicating pregnancy: Secondary | ICD-10-CM

## 2019-02-24 DIAGNOSIS — Z3A38 38 weeks gestation of pregnancy: Secondary | ICD-10-CM

## 2019-02-24 DIAGNOSIS — O0993 Supervision of high risk pregnancy, unspecified, third trimester: Secondary | ICD-10-CM

## 2019-02-24 DIAGNOSIS — O24410 Gestational diabetes mellitus in pregnancy, diet controlled: Secondary | ICD-10-CM

## 2019-02-24 DIAGNOSIS — F17200 Nicotine dependence, unspecified, uncomplicated: Secondary | ICD-10-CM

## 2019-02-24 DIAGNOSIS — F320 Major depressive disorder, single episode, mild: Secondary | ICD-10-CM

## 2019-02-24 NOTE — Progress Notes (Signed)
[facility] VIRTUAL VIDEO VISIT ENCOUNTER NOTE  Provider location: Center for [employer] at [facility]   I connected with Shelby Hayden on 02/24/19 at  1:15 PM EDT by MyChart Video Encounter at home and verified that I am speaking with the correct person using two identifiers.   I discussed the limitations, risks, security and privacy concerns of performing an evaluation and management service virtually and the availability of in person appointments. I also discussed with the patient that there may be a patient responsible charge related to this service. The patient expressed understanding and agreed to proceed. Subjective:  Shelby Hayden is a 23 y.o. G1P0000 at [redacted]w[redacted]d being seen today for ongoing prenatal care.  She is currently monitored for the following issues for this high-risk pregnancy and has Diet controlled gestational diabetes mellitus in third trimester; Depression; Smoker; Supervision of high-risk pregnancy, third trimester; and GBS (group B Streptococcus carrier), +RV culture, currently pregnant on their problem list.  Patient reports no complaints.  Contractions: Not present. Vag. Bleeding: None.  Movement: Present. Denies any leaking of fluid.   The following portions of the patient's history were reviewed and updated as appropriate: allergies, current medications, past family history, past medical history, past social history, past surgical history and problem list.   Objective:  There were no vitals filed for this visit.  Fetal Status:     Movement: Present     General:  Alert, oriented and cooperative. Patient is in no acute distress.  Respiratory: Normal respiratory effort, no problems with respiration noted  Mental Status: Normal mood and affect. Normal behavior. Normal judgment and thought content.  Rest of physical exam deferred due to type of encounter  Imaging: [place] Mfm Fetal Bpp Wo Non Stress  Result Date: 02/11/2019  ----------------------------------------------------------------------  OBSTETRICS REPORT                       (Signed Final 02/11/2019 05:14 pm) ---------------------------------------------------------------------- Patient Info  ID #:       [id number]                          D.O.B.:  17-Sep-1995 (22 yrs)  Name:       Shelby Hayden                        Visit Date: 02/11/2019 04:57 pm ---------------------------------------------------------------------- Performed By  Performed By:     Hubert Azure          Referred By:      MAU Nursing-                    RDMS                                     MAU/Triage  Attending:        Tama High MD        [facility]                                                             [place] ---------------------------------------------------------------------- Orders   #  Description                          Code         Ordered By   1  [place] MFM FETAL BPP WO NON              66440.34     ERIN LAWRENCE      STRESS  ----------------------------------------------------------------------   #  Order #                    Accession #                 Episode #   1  [id number]                  [id number]                  [id number]  ---------------------------------------------------------------------- Indications   Decreased fetal movement                       O36.8190   [redacted] weeks gestation of pregnancy                Z3A.36  ---------------------------------------------------------------------- Fetal Evaluation  Num Of Fetuses:         1  Fetal Heart Rate(bpm):  166  Cardiac Activity:       Observed  Presentation:           Cephalic  Placenta:               Posterior  P. Cord Insertion:      Visualized  Amniotic Fluid  AFI FV:      Within normal limits  AFI Sum(cm)     %Tile       Largest Pocket(cm)  15.48           58          5.55  RUQ(cm)       RLQ(cm)       LUQ(cm)        LLQ(cm)  5.55          0.77          3.77           5.39  Comment:    Diaphragm, stomach,  and bladder seen. ---------------------------------------------------------------------- Biophysical Evaluation  Amniotic F.V:   Within normal limits       F. Tone:        Observed  F. Movement:    Observed                   Score:          8/8  F. Breathing:   Observed ---------------------------------------------------------------------- Gestational Age  Clinical EDD:  36w 4d                                        EDD:   03/07/19  Best:          36w 4d     Det. By:  Clinical EDD             EDD:   03/07/19 ---------------------------------------------------------------------- Impression  Patient was evaluated for c/o decreased fetal movements.  Amniotic fluid is normal and good fetal activity is seen.  Antenatal testing is reassuring. BPP 8/8. ----------------------------------------------------------------------  Noralee Spaceavi Shankar, MD Electronically Signed Final Report   02/11/2019 05:14 pm ----------------------------------------------------------------------  [place] Mfm Ob Detail +14 Wk  Result Date: 02/15/2019 ----------------------------------------------------------------------  OBSTETRICS REPORT                       (Signed Final 02/15/2019 03:27 pm) ---------------------------------------------------------------------- Patient Info  ID #:       [id number]                          D.O.B.:  10-25-1995 (22 yrs)  Name:       Shelby Hayden                        Visit Date: 02/15/2019 02:30 pm ---------------------------------------------------------------------- Performed By  Performed By:     Rennie PlowmanErica Lyskawa          Referred By:      MAU Nursing-                    RDMS                                     MAU/Triage  Attending:        Lin Landsmanorenthian Booker      [facility]                                       [facility] ---------------------------------------------------------------------- Orders   #  Description                          Code         Ordered By    1  US MFM OB DETAIL +14 WK              [id number]     Jaynie CollinsUGONNA ANYANWU  ----------------------------------------------------------------------   #  Order #                    Accession #                 Episode #   1  [id number]                  [id number]                  [id number]  ---------------------------------------------------------------------- Indications   [redacted] weeks gestation of pregnancy                Z3A.37   Gestational diabetes in pregnancy, diet        O24.410   controlled   Encounter for antenatal screening for          Z36.3   malformations   Obesity complicating pregnancy (BMI 30)        O99.210 E66.9   Smoking complicating pregnancy, third          O99.333   trimester  ---------------------------------------------------------------------- Vital Signs  Weight (lb): 181  Height:        5'5"  BMI:         30.12 ---------------------------------------------------------------------- Fetal Evaluation  Num Of Fetuses:         1  Fetal Heart Rate(bpm):  149  Cardiac Activity:       Observed  Presentation:           Cephalic  Placenta:               Left lateral  P. Cord Insertion:      Visualized, central  Amniotic Fluid  AFI FV:      Within normal limits  AFI Sum(cm)     %Tile       Largest Pocket(cm)  13.46           50          3.74  RUQ(cm)       RLQ(cm)       LUQ(cm)        LLQ(cm)  2.95          3.08          3.74           3.69 ---------------------------------------------------------------------- Biometry  BPD:      91.4  mm     G. Age:  37w 1d         65  %    CI:        76.25   %    70 - 86                                                          FL/HC:      21.3   %    20.8 - 22.6  HC:      331.7  mm     G. Age:  37w 6d         41  %    HC/AC:      1.06        0.92 - 1.05  AC:      311.7  mm     G. Age:  35w 1d         12  %    FL/BPD:     77.1   %    71 - 87  FL:       70.5  mm     G. Age:  36w 1d         24  %    FL/AC:      22.6   %    20 - 24  HUM:      60.1  mm      G. Age:  35w 0d         27  %  LV:        4.8  mm  Est. FW:    2791  gm      6 lb 2 oz     25  % ---------------------------------------------------------------------- Gestational Age  Clinical EDD:  37w 1d                                        EDD:   03/07/19  U/S  Today:     36w 4d                                        EDD:   03/11/19  Best:          37w 1d     Det. By:  Clinical EDD             EDD:   03/07/19 ---------------------------------------------------------------------- Anatomy  Cranium:               Appears normal         LVOT:                   Appears normal  Cavum:                 Appears normal         Aortic Arch:            Not well visualized  Ventricles:            Appears normal         Ductal Arch:            Appears normal  Choroid Plexus:        Appears normal         Diaphragm:              Appears normal  Cerebellum:            Appears normal         Stomach:                Appears normal, left                                                                        sided  Posterior Fossa:       Appears normal         Abdomen:                Appears normal  Nuchal Fold:           Not applicable (>20    Abdominal Wall:         Appears nml (cord                         wks GA)                                        insert, abd wall)  Face:                  Appears normal         Cord Vessels:           Appears normal (3                         (orbits and profile)  vessel cord)  Lips:                  Appears normal         Kidneys:                Appear normal  Palate:                Not well visualized    Bladder:                Appears normal  Thoracic:              Appears normal         Spine:                  Ltd views no                                                                        intracranial signs of                                                                        NTD  Heart:                 Appears normal         Upper Extremities:       Visualized                         (4CH, axis, and                         situs)  RVOT:                  Appears normal         Lower Extremities:      Visualized  Other:  Technically difficult due to advanced gestational age and fetal          position. Fetus appears to be a female. Nasal bone visualized. Hands          and feet not well visualized. ---------------------------------------------------------------------- Cervix Uterus Adnexa  Cervix  Not visualized (advanced GA >24wks)  Left Ovary  Not visualized.  Right Ovary  Within normal limits.  Adnexa  No abnormality visualized. ---------------------------------------------------------------------- Impression  Normal interval growth.  A1GDM ---------------------------------------------------------------------- Recommendations  Follow up as clinically indicated. ----------------------------------------------------------------------               Lin Landsmanorenthian Booker, MD Electronically Signed Final Report   02/15/2019 03:27 pm ----------------------------------------------------------------------   Assessment and Plan:  Pregnancy: G1P0000 at 2222w3d 1. Supervision of high-risk pregnancy, third trimester  2. Smoker Last tob use was at 20 weeks   3. GBS (group B Streptococcus carrier), +RV culture, currently pregnant Need atbx in labor.   4. Diet controlled gestational diabetes mellitus in third trimester Fasting Glc 80s 2 hours PP 100-110's    5. Current mild episode of major depressive  disorder, unspecified whether recurrent (HCC) Stable. But, pt reports that she has a like of life stressors.   Term labor symptoms and general obstetric precautions including but not limited to vaginal bleeding, contractions, leaking of fluid and fetal movement were reviewed in detail with the patient. I discussed the assessment and treatment plan with the patient. The patient was provided an opportunity to ask questions and all were answered. The patient agreed with  the plan and demonstrated an understanding of the instructions. The patient was advised to call back or seek an in-person office evaluation/go to MAU at [facility] for any urgent or concerning symptoms. Please refer to After Visit Summary for other counseling recommendations.   I provided 11 minutes of face-to-face time during this encounter.  No follow-ups on file.  Future Appointments  Date Time Provider Department Center  03/03/2019  1:15 PM Willodean Rosenthal, MD [facility]  03/10/2019  1:15 PM Adam Phenix, MD [facility]    Willodean Rosenthal, MD Center for [facility], [facility]

## 2019-03-03 ENCOUNTER — Other Ambulatory Visit: Payer: Self-pay

## 2019-03-03 ENCOUNTER — Encounter: Payer: BC Managed Care – PPO | Admitting: Obstetrics & Gynecology

## 2019-03-03 NOTE — Progress Notes (Signed)
12:58 I called Jeweldean to see if she is able to start her virtual visit early but I heard a message " your call cannot be completed at this time" Shevawn Langenberg,RN 1:20 Madelina is not connected virtually.  I called Keierra for her visit and again heard a message " your call cannot be completed at this time" Will send message to reschedule a visit.  Buffi Ewton,RN

## 2019-03-04 ENCOUNTER — Encounter (HOSPITAL_COMMUNITY): Payer: Self-pay | Admitting: *Deleted

## 2019-03-04 ENCOUNTER — Inpatient Hospital Stay (HOSPITAL_COMMUNITY)
Admission: AD | Admit: 2019-03-04 | Discharge: 2019-03-04 | Disposition: A | Discharge status: Home / Self Care | Payer: BC Managed Care – PPO | Attending: Family Medicine | Admitting: Family Medicine

## 2019-03-04 ENCOUNTER — Other Ambulatory Visit: Payer: Self-pay

## 2019-03-04 DIAGNOSIS — Z880 Allergy status to penicillin: Secondary | ICD-10-CM | POA: Insufficient documentation

## 2019-03-04 DIAGNOSIS — Z87891 Personal history of nicotine dependence: Secondary | ICD-10-CM | POA: Insufficient documentation

## 2019-03-04 DIAGNOSIS — Z3A39 39 weeks gestation of pregnancy: Secondary | ICD-10-CM | POA: Insufficient documentation

## 2019-03-04 DIAGNOSIS — R51 Headache: Secondary | ICD-10-CM | POA: Diagnosis not present

## 2019-03-04 DIAGNOSIS — O26893 Other specified pregnancy related conditions, third trimester: Secondary | ICD-10-CM

## 2019-03-04 DIAGNOSIS — O368130 Decreased fetal movements, third trimester, not applicable or unspecified: Secondary | ICD-10-CM | POA: Insufficient documentation

## 2019-03-04 MED ORDER — BUTALBITAL-APAP-CAFFEINE 50-325-40 MG PO TABS: 1.0000 | ORAL_TABLET | Freq: Four times a day (QID) | ORAL | 0 refills | Status: AC | PRN

## 2019-03-04 MED ORDER — BUTALBITAL-APAP-CAFFEINE 50-325-40 MG PO TABS
2.0000 | ORAL_TABLET | Freq: Once | ORAL | Status: AC
  Administered 2019-03-04: 2 via ORAL
  Filled 2019-03-04: qty 2

## 2019-03-04 NOTE — MAU Note (Addendum)
PT SAYS SHE HAS H/A- STARTED  AT 37 WEEKS - TOLD HER TO TAKE TYLENOL - WORSE TODAY .  [place]- DID NOT CALL TODAY - NEXT VISIT IS 8-6.  TOOK TYLENOL  Tuesday. DENIES BLURRED VISION  OR EPIGASTRIC PAIN  .  LAST TIME BABY MOVED  WAS 8 PM- FHR IN TRIAGE - 140. NO UC'S.  SAYS SHE WAS TAKING BP AT HOME -138/77

## 2019-03-04 NOTE — MAU Provider Note (Signed)
History     CSN: [id number]  Arrival date and time: 03/04/19 2041   First Provider Initiated Contact with Patient 03/04/19 2123      Chief Complaint  Patient presents with  . Headache  . Decreased Fetal Movement   Shelby Hayden is a 23 y.o. G1P0000 at 3166w4d who presents today with a headache, decreased fetal movement and high blood pressure at home. She states that she took her BP at home and it was 130/80. She reports that her blood pressure is usually lower than that. She has also had a headache for about 2 weeks. She has been taking Tylenol, and that helps. She has not taken any tylenol today. She reports that she had not been feeling baby move as much today, but she is feeling normal fetal movement now. She denies any contractions, VB or LOF. Next visit 03/10/2019  Headache  This is a new problem. The current episode started 1 to 4 weeks ago. The problem occurs intermittently. The problem has been unchanged. The pain is located in the right unilateral region. The pain does not radiate. The pain quality is similar to prior headaches. The quality of the pain is described as aching. The pain is at a severity of 5/10. Pertinent negatives include no fever, nausea or vomiting. Nothing aggravates the symptoms. She has tried acetaminophen for the symptoms. The treatment provided mild relief.    OB History    Gravida  1   Para  0   Term  0   Preterm  0   AB  0   Living        SAB  0   TAB  0   Ectopic  0   Multiple      Live Births              Past Medical History:  Diagnosis Date  . Anxiety   . Chronic rhinitis 10/16/2016  . Depression   . Eczema   . GERD (gastroesophageal reflux disease)   . Gestational diabetes   . History of food allergy 10/16/2016  . Medical history non-contributory   . Recurrent headaches 10/16/2016  . Seborrheic dermatitis 10/16/2016    Past Surgical History:  Procedure Laterality Date  . CHOLECYSTECTOMY  08/2015  . CHOLESTEATOMA EXCISION     . gallbladder removed      Family History  Problem Relation Age of Onset  . Cancer Father        prostate  . Alzheimer's disease Maternal Grandfather   . Diabetes Paternal Grandfather   . Asthma Sister   . Allergic rhinitis Sister   . Asthma Maternal Grandmother   . Asthma Paternal Grandmother     Social History   Tobacco Use  . Smoking status: Former Smoker    Packs/day: 1.00    Types: Cigarettes  . Smokeless tobacco: Never Used  . Tobacco comment: none since pregnancy  Substance Use Topics  . Alcohol use: Not Currently  . Drug use: Never    Allergies:  Allergies  Allergen Reactions  . Latex Rash  . Amoxicillin Rash    Medications Prior to Admission  Medication Sig Dispense Refill Last Dose  . acetaminophen (TYLENOL) 500 MG tablet Take 1,000 mg by mouth every 6 (six) hours as needed for mild pain.   Past Week at Unknown time  . calcium carbonate (TUMS - DOSED IN MG ELEMENTAL CALCIUM) 500 MG chewable tablet Chew 1 tablet by mouth as needed for indigestion or heartburn.  Past Week at Unknown time  . Prenatal Vit-Fe Fumarate-FA (PRENATAL MULTIVITAMIN) TABS tablet Take 1 tablet by mouth daily at 12 noon.   03/04/2019 at Unknown time    Review of Systems  Constitutional: Negative for chills and fever.  Gastrointestinal: Negative for nausea and vomiting.  Genitourinary: Negative for dysuria, frequency, pelvic pain, vaginal bleeding and vaginal discharge.  Neurological: Positive for headaches.   Physical Exam   Blood pressure 116/66, pulse 80, temperature 98.4 F (36.9 C), temperature source Oral, resp. rate 20, height 5\' 5"  (1.651 m), weight 95 kg, last menstrual period 05/08/2018.  Physical Exam  Nursing note and vitals reviewed. Constitutional: She is oriented to person, place, and time. She appears well-developed and well-nourished. No distress.  HENT:  Head: Normocephalic.  Cardiovascular: Normal rate.  Respiratory: Effort normal.  GI: Soft. There is no  abdominal tenderness. There is no rebound.  Neurological: She is alert and oriented to person, place, and time. She has normal reflexes.  Skin: Skin is warm and dry.     NST:  Baseline: 155 Variability: moderate Accels: 15x15 Decels: none Toco: none  Vitals:   03/04/19 2057 03/04/19 2113  BP: 123/74 116/66  Pulse: 83 80  Resp: 20   Temp: 98.4 F (36.9 C)   TempSrc: Oral   Weight: 95 kg   Height: 5\' 5"  (1.651 m)     MAU Course  Procedures  MDM NST reactive, and patient feeling fetal movement  Headache is 0/10 after fioricet BP normal here   Assessment and Plan   1. Pregnancy headache in third trimester   2. [redacted] weeks gestation of pregnancy    DC home Comfort measures reviewed  3rd Trimester precautions  labor precautions  Fetal kick counts RX: fioricet PRN #20  Return to MAU as needed FU with OB as planned  [place] for [facility] Follow up.   Specialty: Obstetrics and Gynecology Contact information: [address], [address] [id number] [place] [id number] [place] DNP, CNM  03/04/19  10:57 PM

## 2019-03-04 NOTE — Discharge Instructions (Signed)
Third Trimester of Pregnancy The third trimester is from week 28 through week 40 (months 7 through 9). The third trimester is a time when the unborn baby (fetus) is growing rapidly. At the end of the ninth month, the fetus is about 20 inches in length and weighs 6-10 pounds. Body changes during your third trimester Your body will continue to go through many changes during pregnancy. The changes vary from woman to woman. During the third trimester:  Your weight will continue to increase. You can expect to gain 25-35 pounds (11-16 kg) by the end of the pregnancy.  You may begin to get stretch marks on your hips, abdomen, and breasts.  You may urinate more often because the fetus is moving lower into your pelvis and pressing on your bladder.  You may develop or continue to have heartburn. This is caused by increased hormones that slow down muscles in the digestive tract.  You may develop or continue to have constipation because increased hormones slow digestion and cause the muscles that push waste through your intestines to relax.  You may develop hemorrhoids. These are swollen veins (varicose veins) in the rectum that can itch or be painful.  You may develop swollen, bulging veins (varicose veins) in your legs.  You may have increased body aches in the pelvis, back, or thighs. This is due to weight gain and increased hormones that are relaxing your joints.  You may have changes in your hair. These can include thickening of your hair, rapid growth, and changes in texture. Some women also have hair loss during or after pregnancy, or hair that feels dry or thin. Your hair will most likely return to normal after your baby is born.  Your breasts will continue to grow and they will continue to become tender. A yellow fluid (colostrum) may leak from your breasts. This is the first milk you are producing for your baby.  Your belly button may stick out.  You may notice more swelling in your hands,  face, or ankles.  You may have increased tingling or numbness in your hands, arms, and legs. The skin on your belly may also feel numb.  You may feel short of breath because of your expanding uterus.  You may have more problems sleeping. This can be caused by the size of your belly, increased need to urinate, and an increase in your body's metabolism.  You may notice the fetus "dropping," or moving lower in your abdomen (lightening).  You may have increased vaginal discharge.  You may notice your joints feel loose and you may have pain around your pelvic bone. What to expect at prenatal visits You will have prenatal exams every 2 weeks until week 36. Then you will have weekly prenatal exams. During a routine prenatal visit:  You will be weighed to make sure you and the baby are growing normally.  Your blood pressure will be taken.  Your abdomen will be measured to track your baby's growth.  The fetal heartbeat will be listened to.  Any test results from the previous visit will be discussed.  You may have a cervical check near your due date to see if your cervix has softened or thinned (effaced).  You will be tested for Group B streptococcus. This happens between 35 and 37 weeks. Your health care provider may ask you:  What your birth plan is.  How you are feeling.  If you are feeling the baby move.  If you have had any abnormal   symptoms, such as leaking fluid, bleeding, severe headaches, or abdominal cramping.  If you are using any tobacco products, including cigarettes, chewing tobacco, and electronic cigarettes.  If you have any questions. Other tests or screenings that may be performed during your third trimester include:  Blood tests that check for low iron levels (anemia).  Fetal testing to check the health, activity level, and growth of the fetus. Testing is done if you have certain medical conditions or if there are problems during the pregnancy.  Nonstress test  (NST). This test checks the health of your baby to make sure there are no signs of problems, such as the baby not getting enough oxygen. During this test, a belt is placed around your belly. The baby is made to move, and its heart rate is monitored during movement. What is false labor? False labor is a condition in which you feel small, irregular tightenings of the muscles in the womb (contractions) that usually go away with rest, changing position, or drinking water. These are called Braxton Hicks contractions. Contractions may last for hours, days, or even weeks before true labor sets in. If contractions come at regular intervals, become more frequent, increase in intensity, or become painful, you should see your health care provider. What are the signs of labor?  Abdominal cramps.  Regular contractions that start at 10 minutes apart and become stronger and more frequent with time.  Contractions that start on the top of the uterus and spread down to the lower abdomen and back.  Increased pelvic pressure and dull back pain.  A watery or bloody mucus discharge that comes from the vagina.  Leaking of amniotic fluid. This is also known as your "water breaking." It could be a slow trickle or a gush. Let your health care provider know if it has a color or strange odor. If you have any of these signs, call your health care provider right away, even if it is before your due date. Follow these instructions at home: Medicines  Follow your health care provider's instructions regarding medicine use. Specific medicines may be either safe or unsafe to take during pregnancy.  Take a prenatal vitamin that contains at least 600 micrograms (mcg) of folic acid.  If you develop constipation, try taking a stool softener if your health care provider approves. Eating and drinking   Eat a balanced diet that includes fresh fruits and vegetables, whole grains, good sources of protein such as meat, eggs, or tofu,  and low-fat dairy. Your health care provider will help you determine the amount of weight gain that is right for you.  Avoid raw meat and uncooked cheese. These carry germs that can cause birth defects in the baby.  If you have low calcium intake from food, talk to your health care provider about whether you should take a daily calcium supplement.  Eat four or five small meals rather than three large meals a day.  Limit foods that are high in fat and processed sugars, such as fried and sweet foods.  To prevent constipation: ? Drink enough fluid to keep your urine clear or pale yellow. ? Eat foods that are high in fiber, such as fresh fruits and vegetables, whole grains, and beans. Activity  Exercise only as directed by your health care provider. Most women can continue their usual exercise routine during pregnancy. Try to exercise for 30 minutes at least 5 days a week. Stop exercising if you experience uterine contractions.  Avoid heavy lifting.  Do   not exercise in extreme heat or humidity, or at high altitudes.  Wear low-heel, comfortable shoes.  Practice good posture.  You may continue to have sex unless your health care provider tells you otherwise. Relieving pain and discomfort  Take frequent breaks and rest with your legs elevated if you have leg cramps or low back pain.  Take warm sitz baths to soothe any pain or discomfort caused by hemorrhoids. Use hemorrhoid cream if your health care provider approves.  Wear a good support bra to prevent discomfort from breast tenderness.  If you develop varicose veins: ? Wear support pantyhose or compression stockings as told by your healthcare provider. ? Elevate your feet for 15 minutes, 3-4 times a day. Prenatal care  Write down your questions. Take them to your prenatal visits.  Keep all your prenatal visits as told by your health care provider. This is important. Safety  Wear your seat belt at all times when driving.  Make  a list of emergency phone numbers, including numbers for family, friends, the hospital, and police and fire departments. General instructions  Avoid cat litter boxes and soil used by cats. These carry germs that can cause birth defects in the baby. If you have a cat, ask someone to clean the litter box for you.  Do not travel far distances unless it is absolutely necessary and only with the approval of your health care provider.  Do not use hot tubs, steam rooms, or saunas.  Do not drink alcohol.  Do not use any products that contain nicotine or tobacco, such as cigarettes and e-cigarettes. If you need help quitting, ask your health care provider.  Do not use any medicinal herbs or unprescribed drugs. These chemicals affect the formation and growth of the baby.  Do not douche or use tampons or scented sanitary pads.  Do not cross your legs for long periods of time.  To prepare for the arrival of your baby: ? Take prenatal classes to understand, practice, and ask questions about labor and delivery. ? Make a trial run to the hospital. ? Visit the hospital and tour the maternity area. ? Arrange for maternity or paternity leave through employers. ? Arrange for family and friends to take care of pets while you are in the hospital. ? Purchase a rear-facing car seat and make sure you know how to install it in your car. ? Pack your hospital bag. ? Prepare the baby's nursery. Make sure to remove all pillows and stuffed animals from the baby's crib to prevent suffocation.  Visit your dentist if you have not gone during your pregnancy. Use a soft toothbrush to brush your teeth and be gentle when you floss. Contact a health care provider if:  You are unsure if you are in labor or if your water has broken.  You become dizzy.  You have mild pelvic cramps, pelvic pressure, or nagging pain in your abdominal area.  You have lower back pain.  You have persistent nausea, vomiting, or diarrhea.   You have an unusual or bad smelling vaginal discharge.  You have pain when you urinate. Get help right away if:  Your water breaks before 37 weeks.  You have regular contractions less than 5 minutes apart before 37 weeks.  You have a fever.  You are leaking fluid from your vagina.  You have spotting or bleeding from your vagina.  You have severe abdominal pain or cramping.  You have rapid weight loss or weight gain.  You have   shortness of breath with chest pain.  You notice sudden or extreme swelling of your face, hands, ankles, feet, or legs.  Your baby makes fewer than 10 movements in 2 hours.  You have severe headaches that do not go away when you take medicine.  You have vision changes. Summary  The third trimester is from week 28 through week 40, months 7 through 9. The third trimester is a time when the unborn baby (fetus) is growing rapidly.  During the third trimester, your discomfort may increase as you and your baby continue to gain weight. You may have abdominal, leg, and back pain, sleeping problems, and an increased need to urinate.  During the third trimester your breasts will keep growing and they will continue to become tender. A yellow fluid (colostrum) may leak from your breasts. This is the first milk you are producing for your baby.  False labor is a condition in which you feel small, irregular tightenings of the muscles in the womb (contractions) that eventually go away. These are called Braxton Hicks contractions. Contractions may last for hours, days, or even weeks before true labor sets in.  Signs of labor can include: abdominal cramps; regular contractions that start at 10 minutes apart and become stronger and more frequent with time; watery or bloody mucus discharge that comes from the vagina; increased pelvic pressure and dull back pain; and leaking of amniotic fluid. This information is not intended to replace advice given to you by your health  care provider. Make sure you discuss any questions you have with your health care provider. Document Released: 07/15/2001 Document Revised: 11/11/2018 Document Reviewed: 08/26/2016 Elsevier Patient Education  2020 [employer]  

## 2019-03-08 ENCOUNTER — Encounter: Payer: BC Managed Care – PPO | Admitting: Obstetrics and Gynecology

## 2019-03-10 ENCOUNTER — Other Ambulatory Visit: Payer: Self-pay

## 2019-03-10 ENCOUNTER — Ambulatory Visit: Payer: Self-pay

## 2019-03-10 ENCOUNTER — Ambulatory Visit (INDEPENDENT_AMBULATORY_CARE_PROVIDER_SITE_OTHER): Payer: BC Managed Care – PPO | Admitting: Obstetrics & Gynecology

## 2019-03-10 VITALS — BP 116/62 | HR 85 | Temp 98.5°F | Wt 209.2 lb

## 2019-03-10 DIAGNOSIS — O480 Post-term pregnancy: Secondary | ICD-10-CM

## 2019-03-10 DIAGNOSIS — O24410 Gestational diabetes mellitus in pregnancy, diet controlled: Secondary | ICD-10-CM

## 2019-03-10 DIAGNOSIS — O99820 Streptococcus B carrier state complicating pregnancy: Secondary | ICD-10-CM

## 2019-03-10 DIAGNOSIS — Z3A40 40 weeks gestation of pregnancy: Secondary | ICD-10-CM

## 2019-03-10 DIAGNOSIS — O0993 Supervision of high risk pregnancy, unspecified, third trimester: Secondary | ICD-10-CM

## 2019-03-10 NOTE — Progress Notes (Signed)

## 2019-03-10 NOTE — Progress Notes (Signed)
   PRENATAL VISIT NOTE  Subjective:  Shelby Hayden is a 23 y.o. G1P0000 at [redacted]w[redacted]d being seen today for ongoing prenatal care.  She is currently monitored for the following issues for this high-risk pregnancy and has Diet controlled gestational diabetes mellitus in third trimester; Depression; Smoker; Supervision of high-risk pregnancy, third trimester; and GBS (group B Streptococcus carrier), +RV culture, currently pregnant on their problem list.  Patient reports no complaints.  Contractions: Not present. Vag. Bleeding: None.  Movement: Present. Denies leaking of fluid.   The following portions of the patient's history were reviewed and updated as appropriate: allergies, current medications, past family history, past medical history, past social history, past surgical history and problem list.   Objective:   Vitals:   03/10/19 1320  BP: 116/62  Pulse: 85  Temp: 98.5 F (36.9 C)  Weight: 209 lb 3.2 oz (94.9 kg)    Fetal Status: Fetal Heart Rate (bpm): 143   Movement: Present     General:  Alert, oriented and cooperative. Patient is in no acute distress.  Skin: Skin is warm and dry. No rash noted.   Cardiovascular: Normal heart rate noted  Respiratory: Normal respiratory effort, no problems with respiration noted  Abdomen: Soft, gravid, appropriate for gestational age.  Pain/Pressure: Present     Pelvic: Cervical exam performed        Extremities: Normal range of motion.  Edema: None  Mental Status: Normal mood and affect. Normal behavior. Normal judgment and thought content.   Assessment and Plan:  Pregnancy: G1P0000 at [redacted]w[redacted]d 1. Supervision of high-risk pregnancy, third trimester [redacted]w[redacted]d   2. Diet controlled gestational diabetes mellitus in third trimester Good control reported  3. GBS (group B Streptococcus carrier), +RV culture, currently pregnant IOL scheduled  Term labor symptoms and general obstetric precautions including but not limited to vaginal bleeding, contractions,  leaking of fluid and fetal movement were reviewed in detail with the patient. Please refer to After Visit Summary for other counseling recommendations.   Return in about 4 weeks (around 04/07/2019).  No future appointments.  Emeterio Reeve, MD

## 2019-03-10 NOTE — Patient Instructions (Signed)

## 2019-03-11 ENCOUNTER — Inpatient Hospital Stay (HOSPITAL_COMMUNITY): Payer: BC Managed Care – PPO | Admitting: Anesthesiology

## 2019-03-11 ENCOUNTER — Inpatient Hospital Stay (HOSPITAL_COMMUNITY)
Admission: AD | Admit: 2019-03-11 | Discharge: 2019-03-14 | DRG: 807 | Disposition: A | Discharge status: Home / Self Care | Payer: BC Managed Care – PPO | Attending: Family Medicine | Admitting: Family Medicine

## 2019-03-11 ENCOUNTER — Encounter (HOSPITAL_COMMUNITY): Payer: Self-pay | Admitting: *Deleted

## 2019-03-11 DIAGNOSIS — O24420 Gestational diabetes mellitus in childbirth, diet controlled: Secondary | ICD-10-CM | POA: Diagnosis present

## 2019-03-11 DIAGNOSIS — O480 Post-term pregnancy: Secondary | ICD-10-CM | POA: Diagnosis not present

## 2019-03-11 DIAGNOSIS — O99824 Streptococcus B carrier state complicating childbirth: Secondary | ICD-10-CM | POA: Diagnosis present

## 2019-03-11 DIAGNOSIS — Z20828 Contact with and (suspected) exposure to other viral communicable diseases: Secondary | ICD-10-CM | POA: Diagnosis present

## 2019-03-11 DIAGNOSIS — Z3A40 40 weeks gestation of pregnancy: Secondary | ICD-10-CM | POA: Diagnosis not present

## 2019-03-11 DIAGNOSIS — O24429 Gestational diabetes mellitus in childbirth, unspecified control: Secondary | ICD-10-CM | POA: Diagnosis not present

## 2019-03-11 DIAGNOSIS — O0993 Supervision of high risk pregnancy, unspecified, third trimester: Secondary | ICD-10-CM

## 2019-03-11 DIAGNOSIS — Z87891 Personal history of nicotine dependence: Secondary | ICD-10-CM | POA: Diagnosis not present

## 2019-03-11 LAB — TYPE AND SCREEN
ABO/RH(D): O POS
Antibody Screen: NEGATIVE

## 2019-03-11 LAB — CBC
HCT: 36.9 % (ref 36.0–46.0)
Hemoglobin: 12.3 g/dL (ref 12.0–15.0)
MCH: 28.5 pg (ref 26.0–34.0)
MCHC: 33.3 g/dL (ref 30.0–36.0)
MCV: 85.4 fL (ref 80.0–100.0)
Platelets: 158 10*3/uL (ref 150–400)
RBC: 4.32 MIL/uL (ref 3.87–5.11)
RDW: 13.1 % (ref 11.5–15.5)
WBC: 7.4 10*3/uL (ref 4.0–10.5)
nRBC: 0 % (ref 0.0–0.2)

## 2019-03-11 LAB — SARS CORONAVIRUS 2 BY RT PCR (HOSPITAL ORDER, PERFORMED IN ~~LOC~~ HOSPITAL LAB): SARS Coronavirus 2: NEGATIVE

## 2019-03-11 LAB — GLUCOSE, CAPILLARY
Glucose-Capillary: 69 mg/dL — ABNORMAL LOW (ref 70–99)
Glucose-Capillary: 171 mg/dL — ABNORMAL HIGH (ref 70–99)
Glucose-Capillary: 114 mg/dL — ABNORMAL HIGH (ref 70–99)
Glucose-Capillary: 85 mg/dL (ref 70–99)

## 2019-03-11 MED ORDER — ACETAMINOPHEN 325 MG PO TABS: 650.0000 mg | ORAL_TABLET | ORAL | Status: DC | PRN

## 2019-03-11 MED ORDER — LACTATED RINGERS IV SOLN
INTRAVENOUS | Status: DC
  Administered 2019-03-11 – 2019-03-12 (×6): via INTRAVENOUS

## 2019-03-11 MED ORDER — SOD CITRATE-CITRIC ACID 500-334 MG/5ML PO SOLN: 30.0000 mL | ORAL | Status: DC | PRN

## 2019-03-11 MED ORDER — PENICILLIN G 3 MILLION UNITS IVPB - SIMPLE MED
3.0000 10*6.[IU] | INTRAVENOUS | Status: DC
  Administered 2019-03-11 – 2019-03-12 (×7): 3 10*6.[IU] via INTRAVENOUS
  Filled 2019-03-11 (×11): qty 100

## 2019-03-11 MED ORDER — OXYTOCIN BOLUS FROM INFUSION
500.0000 mL | Freq: Once | INTRAVENOUS | Status: AC
  Administered 2019-03-12: 500 mL via INTRAVENOUS

## 2019-03-11 MED ORDER — FENTANYL CITRATE (PF) 100 MCG/2ML IJ SOLN
100.0000 ug | INTRAMUSCULAR | Status: DC | PRN
  Administered 2019-03-11 (×2): 100 ug via INTRAVENOUS
  Filled 2019-03-11: qty 2

## 2019-03-11 MED ORDER — TERBUTALINE SULFATE 1 MG/ML IJ SOLN: 0.2500 mg | Freq: Once | INTRAMUSCULAR | Status: DC | PRN

## 2019-03-11 MED ORDER — PHENYLEPHRINE 40 MCG/ML (10ML) SYRINGE FOR IV PUSH (FOR BLOOD PRESSURE SUPPORT): 80.0000 ug | PREFILLED_SYRINGE | INTRAVENOUS | Status: DC | PRN

## 2019-03-11 MED ORDER — FLEET ENEMA 7-19 GM/118ML RE ENEM: 1.0000 | ENEMA | RECTAL | Status: DC | PRN

## 2019-03-11 MED ORDER — FENTANYL-BUPIVACAINE-NACL 0.5-0.125-0.9 MG/250ML-% EP SOLN: 12.0000 mL/h | EPIDURAL | Status: DC | PRN

## 2019-03-11 MED ORDER — DIPHENHYDRAMINE HCL 50 MG/ML IJ SOLN
12.5000 mg | INTRAMUSCULAR | Status: DC | PRN
  Filled 2019-03-11: qty 1

## 2019-03-11 MED ORDER — EPHEDRINE 5 MG/ML INJ: 10.0000 mg | INTRAVENOUS | Status: DC | PRN

## 2019-03-11 MED ORDER — OXYCODONE-ACETAMINOPHEN 5-325 MG PO TABS: 1.0000 | ORAL_TABLET | ORAL | Status: DC | PRN

## 2019-03-11 MED ORDER — MISOPROSTOL 50MCG HALF TABLET
ORAL_TABLET | ORAL | Status: AC
  Filled 2019-03-11: qty 1

## 2019-03-11 MED ORDER — MISOPROSTOL 50MCG HALF TABLET
50.0000 ug | ORAL_TABLET | Freq: Once | ORAL | Status: AC
  Administered 2019-03-11: 50 ug via ORAL
  Filled 2019-03-11: qty 1

## 2019-03-11 MED ORDER — ONDANSETRON HCL 4 MG/2ML IJ SOLN
4.0000 mg | Freq: Four times a day (QID) | INTRAMUSCULAR | Status: DC | PRN
  Administered 2019-03-12: 4 mg via INTRAVENOUS
  Filled 2019-03-11: qty 2

## 2019-03-11 MED ORDER — LACTATED RINGERS IV SOLN
500.0000 mL | Freq: Once | INTRAVENOUS | Status: AC
  Administered 2019-03-11: 500 mL via INTRAVENOUS

## 2019-03-11 MED ORDER — MISOPROSTOL 50MCG HALF TABLET
50.0000 ug | ORAL_TABLET | Freq: Once | ORAL | Status: AC
  Administered 2019-03-11: 50 ug via ORAL

## 2019-03-11 MED ORDER — FENTANYL CITRATE (PF) 100 MCG/2ML IJ SOLN
INTRAMUSCULAR | Status: AC
  Administered 2019-03-11: 20:00:00 100 ug via INTRAVENOUS
  Filled 2019-03-11: qty 2

## 2019-03-11 MED ORDER — FENTANYL-BUPIVACAINE-NACL 0.5-0.125-0.9 MG/250ML-% EP SOLN
12.0000 mL/h | EPIDURAL | Status: DC | PRN
  Filled 2019-03-11: qty 250

## 2019-03-11 MED ORDER — OXYTOCIN 40 UNITS IN NORMAL SALINE INFUSION - SIMPLE MED
2.5000 [IU]/h | INTRAVENOUS | Status: DC
  Filled 2019-03-11: qty 1000

## 2019-03-11 MED ORDER — LACTATED RINGERS IV SOLN
500.0000 mL | INTRAVENOUS | Status: DC | PRN
  Administered 2019-03-11 (×2): 500 mL via INTRAVENOUS

## 2019-03-11 MED ORDER — OXYTOCIN 40 UNITS IN NORMAL SALINE INFUSION - SIMPLE MED
1.0000 m[IU]/min | INTRAVENOUS | Status: DC
  Administered 2019-03-11: 2 m[IU]/min via INTRAVENOUS

## 2019-03-11 MED ORDER — LIDOCAINE HCL (PF) 1 % IJ SOLN: 30.0000 mL | INTRAMUSCULAR | Status: DC | PRN

## 2019-03-11 MED ORDER — SODIUM CHLORIDE 0.9 % IV SOLN
5.0000 10*6.[IU] | Freq: Once | INTRAVENOUS | Status: AC
  Administered 2019-03-11: 5 10*6.[IU] via INTRAVENOUS
  Filled 2019-03-11: qty 5

## 2019-03-11 MED ORDER — SODIUM CHLORIDE (PF) 0.9 % IJ SOLN
INTRAMUSCULAR | Status: DC | PRN
  Administered 2019-03-11: 12 mL/h via EPIDURAL

## 2019-03-11 MED ORDER — OXYCODONE-ACETAMINOPHEN 5-325 MG PO TABS: 2.0000 | ORAL_TABLET | ORAL | Status: DC | PRN

## 2019-03-11 MED ORDER — LIDOCAINE HCL (PF) 1 % IJ SOLN
INTRAMUSCULAR | Status: DC | PRN
  Administered 2019-03-11 (×2): 5 mL via EPIDURAL

## 2019-03-11 NOTE — Progress Notes (Addendum)
   Shelby Hayden is a 23 y.o. G1P0000 at [redacted]w[redacted]d  admitted for IOL for postdates  Subjective: Patient feeling uncomfortable with contractions  Objective: Vitals:   03/11/19 1552 03/11/19 1706 03/11/19 1840 03/11/19 1924  BP: 123/70 121/67 127/73   Pulse: 66 64 64   Resp:  20  18  Temp:  97.7 F (36.5 C)    TempSrc:  Oral     No intake/output data recorded.  FHT:  FHR: 135 bpm, variability: moderate,  accelerations:  Present,  decelerations:  Absent UC:   Irregular, uterine irratability SVE:   Dilation: 1.5 Effacement (%): 80 Station: -3 Exam by:: Louvina Cleary Pitocin @ no mu/min  Labs: Lab Results  Component Value Date   WBC 7.4 03/11/2019   HGB 12.3 03/11/2019   HCT 36.9 03/11/2019   MCV 85.4 03/11/2019   PLT 158 03/11/2019    Assessment / Plan: IOL in early labor  -FB placed by Dr. Hulan Fray at [phone] -will hold cytotec for now as patient earlier had Cat 2 tracing (occasional lates and variables); will see if patient begins laboring on her own  Labor: early labor Fetal Wellbeing:  Category I Pain Control:  Labor support without medications Anticipated MOD:  NSVD  Starr Lake 03/11/2019, 8:36 PM

## 2019-03-11 NOTE — Progress Notes (Signed)
LABOR PROGRESS NOTE  Tammala Maltz is a 23 y.o. G1P0000 at [redacted]w[redacted]d  admitted for IOL for postdates.   Subjective: Comfortable with epidural. No concerns at present.   Objective: BP (!) 134/101   Pulse (!) 55   Temp 98.4 F (36.9 C) (Oral)   Resp 16   LMP 05/08/2018 (Exact Date)   SpO2 100%  or  Vitals:   03/11/19 2121 03/11/19 2127 03/11/19 2131 03/11/19 2137  BP: 118/71 109/61 125/69 (!) 134/101  Pulse: 65 85 63 (!) 55  Resp: 16 18 16 16   Temp:      TempSrc:      SpO2: 99% 100%      Dilation: 4 Effacement (%): 50 Cervical Position: Posterior Station: -3 Presentation: Vertex Exam by:: [employer]: baseline rate 140, moderate varibility, +acel, occ variable decel Toco: Irregular with irritability   Labs: Lab Results  Component Value Date   WBC 7.4 03/11/2019   HGB 12.3 03/11/2019   HCT 36.9 03/11/2019   MCV 85.4 03/11/2019   PLT 158 03/11/2019    Patient Active Problem List   Diagnosis Date Noted  . Indication for care in labor and delivery, delivered 03/11/2019  . GBS (group B Streptococcus carrier), +RV culture, currently pregnant 02/23/2019  . Supervision of high-risk pregnancy, third trimester 01/28/2019  . Diet controlled gestational diabetes mellitus in third trimester 12/09/2018  . Smoker 12/23/2017  . Depression 04/28/2014    Assessment / Plan: 23 y.o. G1P0000 at [redacted]w[redacted]d here for IOL for PD. Pregnancy also complicated by B0FBP. Last CBG controlled at 85.   Labor: Induction. S/p Cytotec x2. FB now out, will start Pitocin 2x2.  Fetal Wellbeing:  Cat II but overall reassuring  Pain Control:  Epidural in place  Anticipated MOD:  NSVD   Phill Myron, D.O. OB Fellow  03/11/2019, 9:58 PM

## 2019-03-11 NOTE — Progress Notes (Signed)
[facility]: Labor Progress Note  Subjective: feeling good, occasional cramping. S/p cytotec x 1 buccal; will try FB when cervix is more ripe.   Objective: BP (!) 131/94   Pulse 75   Temp 98.2 F (36.8 C) (Oral)   Resp 20   LMP 05/08/2018 (Exact Date)  Gen: NAD Dilation: Fingertip Effacement (%): 10 Station: Ballotable Presentation: Vertex Exam by:: sparacino  Assessment and Plan: 23 y.o. G1P0000 [redacted]w[redacted]d  Here for IOL for GDMA-1. Last blood sugar was 69, she will order a tray from the carb modified menu.   Labor:  -- pain control: nothing for now, wants epidural eventually.  -- PPH Risk: low  Fetal Well-Being: EFW pending report from yesterday; last [place] at 37 weeks shows baby was 6lbs 2 oz. . Cephalic by [place] yesterday.  -- Category 1 -  -- GBS positive   Maye Hides  11:11 AM

## 2019-03-11 NOTE — Progress Notes (Signed)
LABOR PROGRESS NOTE  Saniyyah Schroepfer is a 23 y.o. G1P0000 at [redacted]w[redacted]d  admitted for IOL 2/2 post dates.  Subjective: Patient is comfortable, feeling more contractions. Coping well. S/P 1 dose of cytotec.  Objective: BP (!) 107/51   Pulse 69   Temp 97.9 F (36.6 C) (Oral)   Resp 20   LMP 05/08/2018 (Exact Date)  or  Vitals:   03/11/19 0927 03/11/19 0928 03/11/19 1058 03/11/19 1203  BP:  124/86 (!) 131/94 (!) 107/51  Pulse:  90 75 69  Resp: 20  20 20   Temp: 98.2 F (36.8 C)   97.9 F (36.6 C)  TempSrc: Oral   Oral    Dilation: 1 Effacement (%): 40 Cervical Position: Posterior Station: -2 Presentation: Vertex Exam by:: sparcino FHT: baseline rate 145, moderate varibility, accels, no decels Toco: CTX q2-3 min  Labs: Lab Results  Component Value Date   WBC 7.4 03/11/2019   HGB 12.3 03/11/2019   HCT 36.9 03/11/2019   MCV 85.4 03/11/2019   PLT 158 03/11/2019    Patient Active Problem List   Diagnosis Date Noted  . Indication for care in labor and delivery, delivered 03/11/2019  . GBS (group B Streptococcus carrier), +RV culture, currently pregnant 02/23/2019  . Supervision of high-risk pregnancy, third trimester 01/28/2019  . Diet controlled gestational diabetes mellitus in third trimester 12/09/2018  . Smoker 12/23/2017  . Depression 04/28/2014    Assessment / Plan: 23 y.o. G1P0000 at [redacted]w[redacted]d here for IOL 2/2 post dates.   Labor: continue with cytotec q4h buccal, consider foley bulb, continue time-appropriate cervical exams.  Fetal Wellbeing:  Fetal status reassuring. EFW 8# by Leopold's. Cephalic by Leopold's and SVE.  -- GBS (positive)- PCN (allergy is actually side effect) -- continuous fetal monitoring   Pain Control:  Desires epidural when contractions become painful  Anticipated MOD:  Anticipate cervical ripening, C/S as appropriate.  Mathis Dad, D.O. OB Fellow  03/11/2019, 1:55 PM
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
## 2019-03-11 NOTE — Progress Notes (Signed)
Foley bulb attempted per [place], cnm.  Attempts unsuccessful

## 2019-03-11 NOTE — Progress Notes (Signed)
   Shelby Hayden is a 23 y.o. G1P0000 at [redacted]w[redacted]d  admitted for A1GDM and pre-eclampsia without severe features.   Subjective: Comfortable, ambulated to the bathroom.    Objective: Vitals:   03/11/19 0928 03/11/19 1058 03/11/19 1203 03/11/19 1347  BP: 124/86 (!) 131/94 (!) 107/51 105/69  Pulse: 90 75 69 97  Resp:  20 20   Temp:   97.9 F (36.6 C)   TempSrc:   Oral    No intake/output data recorded.  FHT:  FHR: 140 bpm, variability: moderate,  accelerations:  Present,  decelerations:  Present occasional late decelerations, none are prolonged. Patient is being constantly being repositioned by RN UC:   irregular, every 2-3 minutes SVE:   Dilation: 1 Effacement (%): 40 Station: -2 Exam by:: sparcino   Labs: Lab Results  Component Value Date   WBC 7.4 03/11/2019   HGB 12.3 03/11/2019   HCT 36.9 03/11/2019   MCV 85.4 03/11/2019   PLT 158 03/11/2019    Assessment / Plan: Early labor  -will give fluid bolus now. At 1 cm, unable to give amnioinfusion -next dose of cytotec is due at 1745, will hold if tracing not improved.  Last blood sugar was 1443, but patient had just eaten lunch two hours earlier. Will continue to monitor q 4.  -blood pressures WNL.   Labor: early labor Fetal Wellbeing:  Category II; with moderate variability and occasional late variables.  Pain Control:  Labor support without medications Anticipated MOD:  NSVD  Shelby Hayden 03/11/2019, 2:56 PM

## 2019-03-11 NOTE — MAU Note (Signed)
Pt denied symptoms. Swab performed.-------------------------------------------

## 2019-03-11 NOTE — Anesthesia Preprocedure Evaluation (Signed)
Anesthesia Evaluation  Patient identified by MRN, date of birth, ID band Patient awake    Reviewed: Allergy & Precautions, H&P , NPO status , Patient's Chart, lab work & pertinent test results  History of Anesthesia Complications Negative for: history of anesthetic complications  Airway Mallampati: II  TM Distance: >3 FB Neck ROM: full    Dental no notable dental hx. (+) Teeth Intact   Pulmonary neg pulmonary ROS, former smoker,    Pulmonary exam normal breath sounds clear to auscultation       Cardiovascular negative cardio ROS Normal cardiovascular exam Rhythm:regular Rate:Normal     Neuro/Psych negative neurological ROS  negative psych ROS   GI/Hepatic negative GI ROS, Neg liver ROS,   Endo/Other  diabetes, Gestational  Renal/GU negative Renal ROS  negative genitourinary   Musculoskeletal   Abdominal   Peds  Hematology negative hematology ROS (+)   Anesthesia Other Findings   Reproductive/Obstetrics (+) Pregnancy                             Anesthesia Physical Anesthesia Plan  ASA: II  Anesthesia Plan: Epidural   Post-op Pain Management:    Induction:   PONV Risk Score and Plan:   Airway Management Planned:   Additional Equipment:   Intra-op Plan:   Post-operative Plan:   Informed Consent: I have reviewed the patients History and Physical, chart, labs and discussed the procedure including the risks, benefits and alternatives for the proposed anesthesia with the patient or authorized representative who has indicated his/her understanding and acceptance.       Plan Discussed with:   Anesthesia Plan Comments:         Anesthesia Quick Evaluation

## 2019-03-11 NOTE — Anesthesia Procedure Notes (Signed)
Epidural Patient location during procedure: OB  Staffing Anesthesiologist: Saadiya Wilfong, MD Performed: anesthesiologist   Preanesthetic Checklist Completed: patient identified, site marked, surgical consent, pre-op evaluation, timeout performed, IV checked, risks and benefits discussed and monitors and equipment checked  Epidural Patient position: sitting Prep: DuraPrep Patient monitoring: heart rate, continuous pulse ox and blood pressure Approach: right paramedian Location: L3-L4 Injection technique: LOR saline  Needle:  Needle type: Tuohy  Needle gauge: 17 G Needle length: 9 cm and 9 Needle insertion depth: 7 cm Catheter type: closed end flexible Catheter size: 20 Guage Catheter at skin depth: 11 cm Test dose: negative  Assessment Events: blood not aspirated, injection not painful, no injection resistance, negative IV test and no paresthesia  Additional Notes Patient identified. Risks/Benefits/Options discussed with patient including but not limited to bleeding, infection, nerve damage, paralysis, failed block, incomplete pain control, headache, blood pressure changes, nausea, vomiting, reactions to medication both or allergic, itching and postpartum back pain. Confirmed with bedside nurse the patient's most recent platelet count. Confirmed with patient that they are not currently taking any anticoagulation, have any bleeding history or any family history of bleeding disorders. Patient expressed understanding and wished to proceed. All questions were answered. Sterile technique was used throughout the entire procedure. Please see nursing notes for vital signs. Test dose was given through epidural needle and negative prior to continuing to dose epidural or start infusion. Warning signs of high block given to the patient including shortness of breath, tingling/numbness in hands, complete motor block, or any concerning symptoms with instructions to call for help. Patient was given  instructions on fall risk and not to get out of bed. All questions and concerns addressed with instructions to call with any issues.     

## 2019-03-12 ENCOUNTER — Encounter (HOSPITAL_COMMUNITY): Payer: Self-pay | Admitting: General Practice

## 2019-03-12 DIAGNOSIS — O24429 Gestational diabetes mellitus in childbirth, unspecified control: Secondary | ICD-10-CM

## 2019-03-12 DIAGNOSIS — O480 Post-term pregnancy: Secondary | ICD-10-CM

## 2019-03-12 DIAGNOSIS — O99824 Streptococcus B carrier state complicating childbirth: Secondary | ICD-10-CM

## 2019-03-12 DIAGNOSIS — Z3A40 40 weeks gestation of pregnancy: Secondary | ICD-10-CM

## 2019-03-12 LAB — GLUCOSE, CAPILLARY
Glucose-Capillary: 86 mg/dL (ref 70–99)
Glucose-Capillary: 78 mg/dL (ref 70–99)
Glucose-Capillary: 111 mg/dL — ABNORMAL HIGH (ref 70–99)
Glucose-Capillary: 76 mg/dL (ref 70–99)
Glucose-Capillary: 62 mg/dL — ABNORMAL LOW (ref 70–99)
Glucose-Capillary: 88 mg/dL (ref 70–99)

## 2019-03-12 LAB — ABO/RH: ABO/RH(D): O POS

## 2019-03-12 LAB — RPR: RPR Ser Ql: NONREACTIVE

## 2019-03-12 MED ORDER — PRENATAL MULTIVITAMIN CH
1.0000 | ORAL_TABLET | Freq: Every day | ORAL | Status: DC
  Administered 2019-03-13: 1 via ORAL
  Filled 2019-03-12: qty 1

## 2019-03-12 MED ORDER — DIPHENHYDRAMINE HCL 50 MG/ML IJ SOLN
25.0000 mg | INTRAMUSCULAR | Status: DC | PRN
  Administered 2019-03-12: 25 mg via INTRAVENOUS

## 2019-03-12 MED ORDER — IBUPROFEN 600 MG PO TABS
600.0000 mg | ORAL_TABLET | Freq: Four times a day (QID) | ORAL | Status: DC
  Administered 2019-03-12 – 2019-03-14 (×6): 600 mg via ORAL
  Filled 2019-03-12 (×6): qty 1

## 2019-03-12 MED ORDER — BENZOCAINE-MENTHOL 20-0.5 % EX AERO: 1.0000 "application " | INHALATION_SPRAY | CUTANEOUS | Status: DC | PRN

## 2019-03-12 MED ORDER — COCONUT OIL OIL
1.0000 "application " | TOPICAL_OIL | Status: DC | PRN
  Administered 2019-03-13: 1 via TOPICAL

## 2019-03-12 MED ORDER — FERROUS SULFATE 325 (65 FE) MG PO TABS
325.0000 mg | ORAL_TABLET | Freq: Two times a day (BID) | ORAL | Status: DC
  Administered 2019-03-13 – 2019-03-14 (×2): 325 mg via ORAL
  Filled 2019-03-12 (×3): qty 1

## 2019-03-12 MED ORDER — ACETAMINOPHEN 325 MG PO TABS: 650.0000 mg | ORAL_TABLET | ORAL | Status: DC | PRN

## 2019-03-12 MED ORDER — DIPHENHYDRAMINE HCL 25 MG PO CAPS: 25.0000 mg | ORAL_CAPSULE | Freq: Four times a day (QID) | ORAL | Status: DC | PRN

## 2019-03-12 MED ORDER — WITCH HAZEL-GLYCERIN EX PADS: 1.0000 "application " | MEDICATED_PAD | CUTANEOUS | Status: DC | PRN

## 2019-03-12 MED ORDER — IBUPROFEN 600 MG PO TABS
600.0000 mg | ORAL_TABLET | Freq: Once | ORAL | Status: AC
  Administered 2019-03-12: 17:00:00 600 mg via ORAL
  Filled 2019-03-12: qty 1

## 2019-03-12 MED ORDER — SIMETHICONE 80 MG PO CHEW: 80.0000 mg | CHEWABLE_TABLET | ORAL | Status: DC | PRN

## 2019-03-12 MED ORDER — DIBUCAINE (PERIANAL) 1 % EX OINT: 1.0000 "application " | TOPICAL_OINTMENT | CUTANEOUS | Status: DC | PRN

## 2019-03-12 MED ORDER — MAGNESIUM HYDROXIDE 400 MG/5ML PO SUSP: 30.0000 mL | ORAL | Status: DC | PRN

## 2019-03-12 NOTE — Progress Notes (Signed)
LABOR PROGRESS NOTE  Shelby Hayden is a 23 y.o. G1P0000 at [redacted]w[redacted]d  admitted for IOL for postdates.   Subjective: Comfortable with epidural. No concerns at present.   Objective: BP (!) 107/56   Pulse 68   Temp 98.4 F (36.9 C) (Oral)   Resp 16   LMP 05/08/2018 (Exact Date)   SpO2 100%  or  Vitals:   03/11/19 2232 03/11/19 2302 03/11/19 2332 03/12/19 0001  BP: (!) 121/102 110/87 (!) 104/54 (!) 107/56  Pulse: 88 87 63 68  Resp: 16 18 16 16   Temp:      TempSrc:      SpO2:        Dilation: 4 Effacement (%): 50 Cervical Position: Posterior Station: -3 Presentation: Vertex Exam by:: B [employer] RN  FHT: baseline rate 140, min-moderate varibility, +acel, occ variable decel Toco: q1-5 min  Labs: Lab Results  Component Value Date   WBC 7.4 03/11/2019   HGB 12.3 03/11/2019   HCT 36.9 03/11/2019   MCV 85.4 03/11/2019   PLT 158 03/11/2019    Patient Active Problem List   Diagnosis Date Noted  . Indication for care in labor and delivery, delivered 03/11/2019  . GBS (group B Streptococcus carrier), +RV culture, currently pregnant 02/23/2019  . Supervision of high-risk pregnancy, third trimester 01/28/2019  . Diet controlled gestational diabetes mellitus in third trimester 12/09/2018  . Smoker 12/23/2017  . Depression 04/28/2014    Assessment / Plan: 23 y.o. G1P0000 at [redacted]w[redacted]d here for IOL for PD. Pregnancy also complicated by I6OEH. Last CBG controlled at 85.   Labor: Induction. S/p Cytotec x2 and FB. Previously on pitocin, taking a break currently due to variable decels.  Fetal Wellbeing:  Cat II but overall reassuring  Pain Control:  Epidural in place  Anticipated MOD:  NSVD   Corliss Blacker, [facility] 03/12/2019, 1:54 AM

## 2019-03-12 NOTE — Progress Notes (Signed)
LABOR PROGRESS NOTE  Shelby Hayden is a 23 y.o. G1P0000 at [redacted]w[redacted]d  admitted for IOL for postdates.   Subjective: Comfortable with epidural. No concerns at present.   Objective: BP (!) 99/54   Pulse 77   Temp 98.2 F (36.8 C) (Oral)   Resp 18   LMP 05/08/2018 (Exact Date)   SpO2 100%  or  Vitals:   03/12/19 0401 03/12/19 0431 03/12/19 0501 03/12/19 0532  BP: 98/61 (!) 103/58 109/64 (!) 99/54  Pulse: 75 72 81 77  Resp: 16 16 18 18   Temp:      TempSrc:      SpO2:        Dilation: 4.5 Effacement (%): 50 Cervical Position: Posterior Station: -3 Presentation: Vertex Exam by:: B [employer] RN  FHT: baseline rate 140, min-moderate varibility, +acel, occ variable decel Toco: q1-3 min  Labs: Lab Results  Component Value Date   WBC 7.4 03/11/2019   HGB 12.3 03/11/2019   HCT 36.9 03/11/2019   MCV 85.4 03/11/2019   PLT 158 03/11/2019    Patient Active Problem List   Diagnosis Date Noted  . Indication for care in labor and delivery, delivered 03/11/2019  . GBS (group B Streptococcus carrier), +RV culture, currently pregnant 02/23/2019  . Supervision of high-risk pregnancy, third trimester 01/28/2019  . Diet controlled gestational diabetes mellitus in third trimester 12/09/2018  . Smoker 12/23/2017  . Depression 04/28/2014    Assessment / Plan: 23 y.o. G1P0000 at [redacted]w[redacted]d here for IOL for PD. Pregnancy also complicated by D4YCX. Last CBG controlled at 86.   Labor: Induction. S/p Cytotec x2 and FB. Pit @4 . Fetal Wellbeing:  Cat II but overall reassuring  Pain Control:  Epidural in place  Anticipated MOD:  NSVD   Corliss Blacker, [facility] 03/12/2019, 5:59 AM

## 2019-03-12 NOTE — Progress Notes (Signed)
Dwayne Single is a 23 y.o. G1P0000 at [redacted]w[redacted]d   Subjective: Reporting significant perineal pressure without urge to push. Coping very well with distraction, epidural in place.  Objective: BP 131/77   Pulse 79   Temp 98.1 F (36.7 C) (Axillary)   Resp 18   LMP 05/08/2018 (Exact Date)   SpO2 100%  I/O last 3 completed shifts: In: -  Out: 500 [Urine:500] Total I/O In: -  Out: 1050 [Urine:1050]  FHT:  FHR: 135 bpm, variability: moderate,  accelerations:  Present,  decelerations:  Absent UC:   regular, every 2 minutes SVE:   Dilation: 9 Effacement (%): 90(swollen from 9 o'clock to 12 o' clock) Station: -1, 0 Exam by:: Ignacia Felling, RN  Labs: Lab Results  Component Value Date   WBC 7.4 03/11/2019   HGB 12.3 03/11/2019   HCT 36.9 03/11/2019   MCV 85.4 03/11/2019   PLT 158 03/11/2019    Assessment / Plan: --23 y.o. G1P0000 at [redacted]w[redacted]d admitted for PDIOL --At bedside to discuss new finding of cervical swelling --Discussed repositioning to far right lateral to reduce swelling, consider ice in glove to cervix if no improvement. Pt amenable --Category 1 tracing --Pitocin reduced by 2 milliunits due to uterine tachysystole --Anticipate NSVD  Darlina Rumpf, CNM 03/12/2019, 2:02 PM

## 2019-03-12 NOTE — Progress Notes (Signed)
Shelby Hayden is a 23 y.o. G1P0000 at [redacted]w[redacted]d admitted for PDIOL  Subjective: Resting on right side with peanut ball. Reporting new onset intermittent perineal pressure. Denies pain.  Objective: BP 118/76   Pulse 66   Temp 98.2 F (36.8 C) (Axillary)   Resp 18   LMP 05/08/2018 (Exact Date)   SpO2 100%  I/O last 3 completed shifts: In: -  Out: 500 [Urine:500] Total I/O In: -  Out: 1000 [Urine:1000]  FHT:  FHR: 125 bpm, variability: moderate,  accelerations:  Present,  decelerations:  Absent UC:   irregular, every 2-3 minutes SVE:   Dilation: 5.5 Effacement (%):70 Station: -3 Exam by:: [employer], RN  Labs: Lab Results  Component Value Date   WBC 7.4 03/11/2019   HGB 12.3 03/11/2019   HCT 36.9 03/11/2019   MCV 85.4 03/11/2019   PLT 158 03/11/2019    Assessment / Plan: --23 y.o. G1P0000 at [redacted]w[redacted]d admitted for PDIOL -- A1GDM, CBG q 2 hour, well controlled --Pitocin infusing at 10 milliunits, continue titration PRN --Adequate MVUs --Continue position changes with peanut q 1 hour --Epidural in place and effective --Anticipate NSVD   Darlina Rumpf, CNM 03/12/2019, 1130 AM

## 2019-03-12 NOTE — Discharge Instructions (Signed)

## 2019-03-12 NOTE — Discharge Summary (Signed)
Obstetrics Discharge Summary [facility]   Patient Name: Shelby Hayden DOB: 05/23/1996 MRN: [id number]  Date of admission: 03/11/2019 Delivering MD: Darlina Rumpf   Date of discharge: 03/14/2019  Admitting diagnosis: Induction Intrauterine pregnancy: [redacted]w[redacted]d     Secondary diagnosis:   Active Problems:   Indication for care in labor and delivery, delivered   Additional problems:  - A1GDM     Discharge diagnosis: Term Pregnancy Delivered                                            Postpartum procedures:  Complications: None  Outpatient Follow-Up: 4-6 weeks  Hospital course: Shelby Hayden is a 23 y.o. [redacted]w[redacted]d who was admitted for IOL due to A1GDM. Her pregnancy was complicated by T7DUK. Delivery was complicated by none. Please see delivery/op note for additional details. Her postpartum course was uncomplicated. She was breastfeeding without difficulty. By day of discharge, she was passing flatus, urinating, eating and drinking without difficulty. Her pain was well-controlled, and she was discharged home with 2. She will follow-up in clinic in 4 weeks.   Physical exam  Vitals:   03/13/19 0359 03/13/19 0528 03/13/19 1926 03/14/19 0542  BP: 121/70 120/86 (!) 134/96 112/83  Pulse: 67 66 69 (!) 57  Resp: 18 18 17 16   Temp: 98.2 F (36.8 C)  98.3 F (36.8 C) 98.3 F (36.8 C)  TempSrc: Oral  Oral Oral  SpO2: 99%  100% 100%   General: Alert, oriented and in no acute distress Lochia: appropriate Uterine Fundus: firm Incision: N/A DVT Evaluation: No significant calf/ankle edema. Labs: Lab Results  Component Value Date   WBC 7.4 03/11/2019   HGB 12.3 03/11/2019   HCT 36.9 03/11/2019   MCV 85.4 03/11/2019   PLT 158 03/11/2019   CMP Latest Ref Rng & Units 12/22/2018  Glucose 70 - 99 mg/dL 141(H)  BUN 6 - 20 mg/dL <5(L)  Creatinine 0.44 - 1.00 mg/dL 0.41(L)  Sodium 135 - 145 mmol/L 137  Potassium 3.5 - 5.1 mmol/L 3.4(L)  Chloride 98 - 111 mmol/L 108  CO2 22 - 32 mmol/L  21(L)  Calcium 8.9 - 10.3 mg/dL 8.8(L)    Discharge instructions: Per After Visit Summary and "Baby and Me Booklet"  After visit meds:  Allergies as of 03/14/2019      Reactions   Latex Rash   Amoxicillin Rash      Medication List    TAKE these medications   butalbital-acetaminophen-caffeine 50-325-40 MG tablet Commonly known as: FIORICET Take 1-2 tablets by mouth every 6 (six) hours as needed for headache.   calcium carbonate 500 MG chewable tablet Commonly known as: TUMS - dosed in mg elemental calcium Chew 1 tablet by mouth as needed for indigestion or heartburn.   ibuprofen 800 MG tablet Commonly known as: ADVIL Take 1 tablet (800 mg total) by mouth every 8 (eight) hours as needed.   prenatal multivitamin Tabs tablet Take 1 tablet by mouth daily at 12 noon.       Postpartum contraception: Progesterone only pills Diet: Routine Diet Activity: Advance as tolerated. Pelvic rest for 6 weeks.   Follow-up Appt:No future appointments. Follow-up Visit:No follow-ups on file.  Newborn Data: Live born female  Birth Weight: 7 lb 8.1 oz (3405 g) APGAR: 7, 9  Newborn Delivery   Birth date/time: 03/12/2019 16:22:00 Delivery type: Vaginal, Spontaneous  Baby Feeding: Breast Disposition:home with mother  CNM attestation  I have seen this patient, and confirm the information in this note is accurate. I have re-performed the physical exam and all medical decision making.   Thressa ShellerHeather Yordy Matton DNP, CNM  03/14/19  10:22 AM

## 2019-03-12 NOTE — Progress Notes (Signed)
LABOR PROGRESS NOTE  Shelah Bradner is a 23 y.o. G1P0000 at [redacted]w[redacted]d  admitted for IOL for postdates.   Subjective: Comfortable with epidural. No concerns at present.   Objective: BP (!) 103/59   Pulse 60   Temp 97.6 F (36.4 C) (Oral)   Resp 18   LMP 05/08/2018 (Exact Date)   SpO2 100%  or  Vitals:   03/12/19 0602 03/12/19 0631 03/12/19 0701 03/12/19 0730  BP: (!) 92/55 106/67 99/64 (!) 103/59  Pulse: 83 78 78 60  Resp: 16 16 16 18   Temp: 98.5 F (36.9 C)   97.6 F (36.4 C)  TempSrc: Oral   Oral  SpO2:        Dilation: 6.5 Effacement (%): 50 Cervical Position: Posterior Station: -3 Presentation: Vertex Exam by:: [employer]: baseline rate 150, min-moderate varibility, +acel, no decels Toco: q1-3 min  Labs: Lab Results  Component Value Date   WBC 7.4 03/11/2019   HGB 12.3 03/11/2019   HCT 36.9 03/11/2019   MCV 85.4 03/11/2019   PLT 158 03/11/2019    Patient Active Problem List   Diagnosis Date Noted  . Indication for care in labor and delivery, delivered 03/11/2019  . GBS (group B Streptococcus carrier), +RV culture, currently pregnant 02/23/2019  . Supervision of high-risk pregnancy, third trimester 01/28/2019  . Diet controlled gestational diabetes mellitus in third trimester 12/09/2018  . Smoker 12/23/2017  . Depression 04/28/2014    Assessment / Plan: 23 y.o. G1P0000 at [redacted]w[redacted]d here for IOL for PD. Pregnancy also complicated by Y6AYT. Last CBG 111.   Labor: Induction. S/p Cytotec x2 and FB. Pit @8 . SROM @0730  with IUPC placement for closer monitoring. Continue time appropriate cervical exams. Fetal Wellbeing:  Cat II but overall reassuring  Pain Control:  Epidural in place  Anticipated MOD:  NSVD   Corliss Blacker, [facility] 03/12/2019, 8:06 AM

## 2019-03-12 NOTE — Anesthesia Postprocedure Evaluation (Signed)
Anesthesia Post Note  Patient: Shelby Hayden  Procedure(s) Performed: AN AD HOC LABOR EPIDURAL     Patient location during evaluation: Mother Baby Anesthesia Type: Epidural Level of consciousness: awake and alert Pain management: pain level controlled Vital Signs Assessment: post-procedure vital signs reviewed and stable Respiratory status: spontaneous breathing, nonlabored ventilation and respiratory function stable Cardiovascular status: stable Postop Assessment: no headache, no backache and epidural receding Anesthetic complications: no    Last Vitals:  Vitals:   03/12/19 1732 03/12/19 1745  BP: 123/81 119/85  Pulse: 73 71  Resp: 18 18  Temp:    SpO2:      Last Pain:  Vitals:   03/12/19 1745  TempSrc:   PainSc: 0-No pain   Pain Goal:                   Tamasha Laplante

## 2019-03-13 LAB — GLUCOSE, CAPILLARY
Glucose-Capillary: 69 mg/dL — ABNORMAL LOW (ref 70–99)
Glucose-Capillary: 105 mg/dL — ABNORMAL HIGH (ref 70–99)
Glucose-Capillary: 93 mg/dL (ref 70–99)

## 2019-03-13 NOTE — Progress Notes (Signed)
Patient's fasting blood sugar this a.m. was 69, patient asymptomatic.  Patient was given juice and crackers.  Recheck after 15 minutes blood sugar was 105.

## 2019-03-13 NOTE — Progress Notes (Signed)
MOB was referred for history of depression/anxiety. * Referral screened out by Clinical Social Worker because none of the following criteria appear to apply: ~ History of anxiety/depression during this pregnancy, or of post-partum depression following prior delivery. ~ Diagnosis of anxiety and/or depression within last 3 years OR * MOB's symptoms currently being treated with medication and/or therapy. Please contact the Clinical Social Worker if needs arise, by MOB request, or if MOB scores greater than 9/yes to question 10 on Edinburgh Postpartum Depression Screen.  

## 2019-03-13 NOTE — Progress Notes (Signed)
Patient ID: Shelby Hayden, female   DOB: 1996/01/19, 23 y.o.   MRN: [id number] Attending Circumcision Counseling Progress Note  Patient desires circumcision for her female infant.  Circumcision procedure details discussed, risks and benefits of procedure were also discussed.  These include but are not limited to: Benefits of circumcision in men include reduction in the rates of urinary tract infection (UTI), penile cancer, some sexually transmitted infections, penile inflammatory and retractile disorders, as well as easier hygiene.  Risks include bleeding , infection, injury of glans which may lead to penile deformity or urinary tract issues, unsatisfactory cosmetic appearance and other potential complications related to the procedure.  It was emphasized that this is an elective procedure.  Patient wants to proceed with circumcision; written informed consent obtained.  Will do circumcision soon, routine circumcision and post circumcision care ordered for the infant.  Winona Sison L. Rip Harbour, M.D. 03/13/2019 6:08 PM

## 2019-03-13 NOTE — Lactation Note (Signed)
This note was copied from a baby's chart. Lactation Consultation Note  Patient Name: Shelby Hayden [id number] Date: 03/13/2019 Reason for consult: Initial assessment;1st time breastfeeding;Term P1, 12 hour female infant. Infant had one stools since delivery. Mom breastfeed twice since delivery for 15 and 30 minutes. Infant was spitty when [place] in room wet burps. This is mom's third attempt to breastfeed. Infant did not latch in L&D. Infant was reluctant to latch at this time. Mom hand express and infant was given 5 ml of colostrum by spoon. Mom is short shafted was given breast shells to wear in bra during the day and hand pump to pre-pump prior to latching infant to breast. Mom shown how to use hand pump  & how to disassemble, clean, & reassemble parts. Mom was doing STS when [facility] left the room.  Mom knows to breastfeed according to hunger cues 8 to 12 times, and on demand. Reviewed Baby & Me book's Breastfeeding Basics.  Mom made aware of O/P services, breastfeeding support groups, community resources, and our phone # for post-discharge questions.  Maternal Data Formula Feeding for Exclusion: No Has patient been taught Hand Expression?: Yes(Infant given 5 ml of colostrum by spoon.)  Feeding Feeding Type: Breast Fed  LATCH Score Latch: Too sleepy or reluctant, no latch achieved, no sucking elicited.  Audible Swallowing: None  Type of Nipple: Everted at rest and after stimulation  Comfort (Breast/Nipple): Soft / non-tender  Hold (Positioning): Assistance needed to correctly position infant at breast and maintain latch.  LATCH Score: 5  Interventions Interventions: Breast feeding basics reviewed;Assisted with latch;Adjust position;Support pillows;Breast compression;Hand pump;Skin to skin;Breast massage;Position options;Hand express;Expressed milk;Pre-pump if needed;Shells  Lactation Tools Discussed/Used Tools: Shells;Pump Shell Type: (flat and short shafted.) Breast pump type:  Manual WIC Program: Yes Pump Review: Setup, frequency, and cleaning Initiated by:: Vicente Serene, IBCLC Date initiated:: 03/13/19   Consult Status Consult Status: Follow-up Date: 03/13/19 Follow-up type: In-patient    Vicente Serene 03/13/2019, 4:42 AM

## 2019-03-13 NOTE — Progress Notes (Signed)
POSTPARTUM PROGRESS NOTE  Subjective: Shelby Hayden is a 23 y.o. [id number] s/p uncomplicated SVD at [redacted]w[redacted]d.  She reports she doing well. No acute events overnight. She denies any problems with ambulating, voiding or po intake. Denies nausea or vomiting. She has passed flatus. Pain is well controlled.  Lochia is appropriate.  Objective: Blood pressure 120/86, pulse 66, temperature 98.2 F (36.8 C), temperature source Oral, resp. rate 18, last menstrual period 05/08/2018, SpO2 99 %, unknown if currently breastfeeding.  Physical Exam:  General: alert, cooperative and no distress Chest: no respiratory distress Abdomen: soft, non-tender  Uterine Fundus: firm, appropriately tender Extremities: No calf swelling or tenderness  no edema  Recent Labs    03/11/19 0818  HGB 12.3  HCT 36.9    Assessment/Plan: Shelby Hayden is a 23 y.o. [id number] s/p uncomplicated SVD at [redacted]w[redacted]d for IOL-PD.  Routine Postpartum Care: Doing well, pain well-controlled.  -- Continue routine care, lactation support  -- Contraception: POPs -- Feeding: breast  Dispo: Plan for discharge tomorrow.  Corliss Blacker, [facility]

## 2019-03-14 LAB — GLUCOSE, CAPILLARY
Glucose-Capillary: 74 mg/dL (ref 70–99)
Glucose-Capillary: 107 mg/dL — ABNORMAL HIGH (ref 70–99)

## 2019-03-14 MED ORDER — IBUPROFEN 800 MG PO TABS: 800.0000 mg | ORAL_TABLET | Freq: Three times a day (TID) | ORAL | 0 refills | Status: AC | PRN

## 2019-03-14 NOTE — Progress Notes (Signed)
Discussed with mom and she still desires circ, pt consented.  Durene Romans MD Attending [facility] (Faculty Practice) 03/14/2019 Time: [phone]

## 2019-03-14 NOTE — Progress Notes (Signed)
Patient ID: Shelby Hayden, female   DOB: June 11, 1996, 23 y.o.   MRN: [id number] Patient seen and assessed by nursing staff during this encounter. I have reviewed the chart and agree with the documentation and plan.  Emeterio Reeve, MD 03/14/2019 3:28 PM

## 2019-03-14 NOTE — Lactation Note (Signed)
This note was copied from a baby's chart. Lactation Consultation Note  Patient Name: Shelby Hayden [id number] Date: 03/14/2019 Reason for consult: Follow-up assessment;[place];1st time breastfeeding;Term  LC in to check on P1 Mom on day of discharge.  Term baby at 86 hrs old and at 5% weight loss.   Mom packed up and ready to leave.  Baby post circumcision sleeping swaddled in crib. Encouraged STS and cue based feedings (goal 8-12 feedings per 24 hrs) Mom denies any discomfort with latching.  Reviewed basics.   Engorgement prevention and treatment reviewed.   Mom aware of OP lactation support available to her. Encouraged to call prn for concerns  Interventions Interventions: Breast feeding basics reviewed;Skin to skin;Breast massage;Hand express    Consult Status Consult Status: Complete Date: 03/14/19 Follow-up type: Call as needed    Broadus John 03/14/2019, 11:45 AM

## 2019-04-07 ENCOUNTER — Other Ambulatory Visit: Payer: Self-pay | Admitting: *Deleted

## 2019-04-07 DIAGNOSIS — O24429 Gestational diabetes mellitus in childbirth, unspecified control: Secondary | ICD-10-CM

## 2019-04-08 ENCOUNTER — Telehealth: Payer: Self-pay | Admitting: Student

## 2019-04-08 NOTE — Telephone Encounter (Signed)
Attempted to call patient about her appointment on 9/8 @ 10:55. No answer, and could not leave a message because the number stated that the call could not be completed

## 2019-04-12 ENCOUNTER — Other Ambulatory Visit: Payer: Self-pay

## 2019-04-12 ENCOUNTER — Telehealth: Payer: BC Managed Care – PPO | Admitting: Student

## 2019-04-12 ENCOUNTER — Telehealth: Payer: Self-pay | Admitting: Student

## 2019-04-12 ENCOUNTER — Encounter: Payer: Self-pay | Admitting: Student

## 2019-04-12 ENCOUNTER — Other Ambulatory Visit: Payer: BC Managed Care – PPO

## 2019-04-12 NOTE — Progress Notes (Signed)
Pt did not keep appointment. Can reschedule.  Jorje Guild, NP

## 2019-04-12 NOTE — Telephone Encounter (Signed)
Attempted to call patient about her missed appointment on 9/8. No answer, number stated that the call could not be completed at this time. No show letter mailed.

## 2019-04-12 NOTE — Progress Notes (Signed)
@  11am message stating that the call could not be completed as dialed to check the number and dial again @1105am  message stating that the call could not be completed as dialed to check the number and dial again

## 2019-04-26 ENCOUNTER — Telehealth: Payer: Self-pay | Admitting: Family Medicine

## 2019-04-26 NOTE — Telephone Encounter (Signed)
Attempted to call patient about her appointment on 9/23 @ 8:50. No answer, and could not leave a message because the number stated that the call could not be completed

## 2019-04-27 ENCOUNTER — Other Ambulatory Visit: Payer: BC Managed Care – PPO

## 2019-05-06 ENCOUNTER — Other Ambulatory Visit: Payer: BC Managed Care – PPO

## 2019-05-19 ENCOUNTER — Encounter: Payer: Self-pay | Admitting: *Deleted

## 2021-03-18 ENCOUNTER — Telehealth: Payer: Self-pay

## 2021-03-18 NOTE — Telephone Encounter (Signed)
Transition Care Management Unsuccessful Follow-up Telephone Call  Date of discharge and from where:  03/16/2021-[facility] [place]  Attempts:  1st Attempt  Reason for unsuccessful TCM follow-up call:  Unable to reach patient-wrong number

## 2021-03-19 NOTE — Telephone Encounter (Signed)
Transition Care Management Unsuccessful Follow-up Telephone Call  Date of discharge and from where:  03/16/2021-[facility] [place]  Attempts:  2nd Attempt  Reason for unsuccessful TCM follow-up call:  Unable to reach patient-wrong number

## 2021-03-20 NOTE — Telephone Encounter (Signed)
Transition Care Management Unsuccessful Follow-up Telephone Call  Date of discharge and from where:  03/16/2021-[facility] [place]  Attempts:  3rd Attempt  Reason for unsuccessful TCM follow-up call:  Unable to reach patient-wrong number

## 2022-03-27 DIAGNOSIS — H60331 Swimmer's ear, right ear: Secondary | ICD-10-CM | POA: Diagnosis not present

## 2022-03-27 DIAGNOSIS — Z720 Tobacco use: Secondary | ICD-10-CM | POA: Diagnosis not present

## 2022-03-27 DIAGNOSIS — Z91040 Latex allergy status: Secondary | ICD-10-CM | POA: Diagnosis not present

## 2022-03-27 DIAGNOSIS — E876 Hypokalemia: Secondary | ICD-10-CM | POA: Diagnosis not present

## 2022-03-27 DIAGNOSIS — K760 Fatty (change of) liver, not elsewhere classified: Secondary | ICD-10-CM | POA: Diagnosis not present

## 2022-03-27 DIAGNOSIS — R1013 Epigastric pain: Secondary | ICD-10-CM | POA: Diagnosis not present

## 2022-03-27 DIAGNOSIS — R112 Nausea with vomiting, unspecified: Secondary | ICD-10-CM | POA: Diagnosis not present

## 2023-04-24 DIAGNOSIS — Z0000 Encounter for general adult medical examination without abnormal findings: Secondary | ICD-10-CM | POA: Diagnosis not present

## 2023-04-24 DIAGNOSIS — R7303 Prediabetes: Secondary | ICD-10-CM | POA: Diagnosis not present

## 2023-04-24 DIAGNOSIS — Z111 Encounter for screening for respiratory tuberculosis: Secondary | ICD-10-CM | POA: Diagnosis not present

## 2023-04-24 DIAGNOSIS — E559 Vitamin D deficiency, unspecified: Secondary | ICD-10-CM | POA: Diagnosis not present

## 2023-04-24 DIAGNOSIS — Z0184 Encounter for antibody response examination: Secondary | ICD-10-CM | POA: Diagnosis not present

## 2023-04-24 DIAGNOSIS — R5383 Other fatigue: Secondary | ICD-10-CM | POA: Diagnosis not present

## 2023-05-01 DIAGNOSIS — E1165 Type 2 diabetes mellitus with hyperglycemia: Secondary | ICD-10-CM | POA: Diagnosis not present

## 2023-05-01 DIAGNOSIS — Z79899 Other long term (current) drug therapy: Secondary | ICD-10-CM | POA: Diagnosis not present

## 2023-05-15 DIAGNOSIS — L659 Nonscarring hair loss, unspecified: Secondary | ICD-10-CM | POA: Diagnosis not present

## 2023-05-15 DIAGNOSIS — E038 Other specified hypothyroidism: Secondary | ICD-10-CM | POA: Diagnosis not present

## 2023-09-28 DIAGNOSIS — F332 Major depressive disorder, recurrent severe without psychotic features: Secondary | ICD-10-CM | POA: Diagnosis not present

## 2023-09-28 DIAGNOSIS — E1169 Type 2 diabetes mellitus with other specified complication: Secondary | ICD-10-CM | POA: Diagnosis not present

## 2023-09-28 DIAGNOSIS — F411 Generalized anxiety disorder: Secondary | ICD-10-CM | POA: Diagnosis not present

## 2023-09-28 DIAGNOSIS — E559 Vitamin D deficiency, unspecified: Secondary | ICD-10-CM | POA: Diagnosis not present

## 2023-09-28 DIAGNOSIS — E538 Deficiency of other specified B group vitamins: Secondary | ICD-10-CM | POA: Diagnosis not present

## 2023-09-28 DIAGNOSIS — E785 Hyperlipidemia, unspecified: Secondary | ICD-10-CM | POA: Diagnosis not present

## 2023-09-28 DIAGNOSIS — R5383 Other fatigue: Secondary | ICD-10-CM | POA: Diagnosis not present

## 2023-09-28 DIAGNOSIS — E1165 Type 2 diabetes mellitus with hyperglycemia: Secondary | ICD-10-CM | POA: Diagnosis not present

## 2023-09-28 DIAGNOSIS — G4701 Insomnia due to medical condition: Secondary | ICD-10-CM | POA: Diagnosis not present

## 2023-11-11 DIAGNOSIS — G4700 Insomnia, unspecified: Secondary | ICD-10-CM | POA: Diagnosis not present

## 2023-11-11 DIAGNOSIS — F332 Major depressive disorder, recurrent severe without psychotic features: Secondary | ICD-10-CM | POA: Diagnosis not present

## 2023-11-11 DIAGNOSIS — F411 Generalized anxiety disorder: Secondary | ICD-10-CM | POA: Diagnosis not present

## 2023-12-09 DIAGNOSIS — E063 Autoimmune thyroiditis: Secondary | ICD-10-CM | POA: Diagnosis not present

## 2023-12-09 DIAGNOSIS — G4700 Insomnia, unspecified: Secondary | ICD-10-CM | POA: Diagnosis not present

## 2023-12-09 DIAGNOSIS — R7401 Elevation of levels of liver transaminase levels: Secondary | ICD-10-CM | POA: Diagnosis not present

## 2023-12-09 DIAGNOSIS — F411 Generalized anxiety disorder: Secondary | ICD-10-CM | POA: Diagnosis not present

## 2023-12-09 DIAGNOSIS — E1165 Type 2 diabetes mellitus with hyperglycemia: Secondary | ICD-10-CM | POA: Diagnosis not present

## 2023-12-15 DIAGNOSIS — L400 Psoriasis vulgaris: Secondary | ICD-10-CM | POA: Diagnosis not present

## 2023-12-15 DIAGNOSIS — E1169 Type 2 diabetes mellitus with other specified complication: Secondary | ICD-10-CM | POA: Diagnosis not present

## 2023-12-15 DIAGNOSIS — R110 Nausea: Secondary | ICD-10-CM | POA: Diagnosis not present

## 2023-12-15 DIAGNOSIS — L700 Acne vulgaris: Secondary | ICD-10-CM | POA: Diagnosis not present

## 2023-12-15 DIAGNOSIS — E66813 Obesity, class 3: Secondary | ICD-10-CM | POA: Diagnosis not present

## 2023-12-15 DIAGNOSIS — L409 Psoriasis, unspecified: Secondary | ICD-10-CM | POA: Diagnosis not present

## 2023-12-15 DIAGNOSIS — L719 Rosacea, unspecified: Secondary | ICD-10-CM | POA: Diagnosis not present

## 2023-12-15 DIAGNOSIS — Z6841 Body Mass Index (BMI) 40.0 and over, adult: Secondary | ICD-10-CM | POA: Diagnosis not present

## 2024-04-27 DIAGNOSIS — L400 Psoriasis vulgaris: Secondary | ICD-10-CM | POA: Diagnosis not present

## 2024-04-27 DIAGNOSIS — L821 Other seborrheic keratosis: Secondary | ICD-10-CM | POA: Diagnosis not present

## 2024-04-27 DIAGNOSIS — E1165 Type 2 diabetes mellitus with hyperglycemia: Secondary | ICD-10-CM | POA: Diagnosis not present

## 2024-04-27 DIAGNOSIS — E063 Autoimmune thyroiditis: Secondary | ICD-10-CM | POA: Diagnosis not present

## 2024-04-27 DIAGNOSIS — Z6841 Body Mass Index (BMI) 40.0 and over, adult: Secondary | ICD-10-CM | POA: Diagnosis not present

## 2024-04-27 DIAGNOSIS — R29818 Other symptoms and signs involving the nervous system: Secondary | ICD-10-CM | POA: Diagnosis not present

## 2024-04-27 DIAGNOSIS — E66813 Obesity, class 3: Secondary | ICD-10-CM | POA: Diagnosis not present

## 2024-04-27 DIAGNOSIS — E559 Vitamin D deficiency, unspecified: Secondary | ICD-10-CM | POA: Diagnosis not present

## 2024-06-15 DIAGNOSIS — Z0000 Encounter for general adult medical examination without abnormal findings: Secondary | ICD-10-CM | POA: Diagnosis not present

## 2024-06-15 DIAGNOSIS — E038 Other specified hypothyroidism: Secondary | ICD-10-CM | POA: Diagnosis not present

## 2024-06-15 DIAGNOSIS — E1169 Type 2 diabetes mellitus with other specified complication: Secondary | ICD-10-CM | POA: Diagnosis not present

## 2024-06-15 DIAGNOSIS — H1013 Acute atopic conjunctivitis, bilateral: Secondary | ICD-10-CM | POA: Diagnosis not present

## 2024-06-15 DIAGNOSIS — L719 Rosacea, unspecified: Secondary | ICD-10-CM | POA: Diagnosis not present

## 2024-06-15 DIAGNOSIS — Z139 Encounter for screening, unspecified: Secondary | ICD-10-CM | POA: Diagnosis not present

## 2024-06-22 DIAGNOSIS — L400 Psoriasis vulgaris: Secondary | ICD-10-CM | POA: Diagnosis not present

## 2024-06-28 DIAGNOSIS — L0100 Impetigo, unspecified: Secondary | ICD-10-CM | POA: Diagnosis not present
